# Patient Record
Sex: Male | Born: 1960 | Race: Black or African American | Hispanic: No | Marital: Married | Smoking: Current every day smoker
Health system: Southern US, Community
[De-identification: ages and names within clinical notes are randomized; demographics above are authoritative.]

## PROBLEM LIST (undated history)

## (undated) DIAGNOSIS — B20 Human immunodeficiency virus [HIV] disease: Secondary | ICD-10-CM

## (undated) DIAGNOSIS — Z21 Asymptomatic human immunodeficiency virus [HIV] infection status: Secondary | ICD-10-CM

## (undated) HISTORY — DX: Human immunodeficiency virus (HIV) disease: B20

## (undated) HISTORY — PX: OTHER SURGICAL HISTORY: SHX169

## (undated) HISTORY — PX: BACK SURGERY: SHX140

## (undated) HISTORY — DX: Asymptomatic human immunodeficiency virus (hiv) infection status: Z21

---

## 2016-02-16 ENCOUNTER — Ambulatory Visit: Payer: Self-pay

## 2016-02-17 ENCOUNTER — Encounter: Payer: Self-pay | Admitting: Internal Medicine

## 2016-02-26 ENCOUNTER — Ambulatory Visit: Payer: Self-pay | Admitting: Internal Medicine

## 2016-03-15 DIAGNOSIS — B20 Human immunodeficiency virus [HIV] disease: Secondary | ICD-10-CM | POA: Insufficient documentation

## 2016-03-16 ENCOUNTER — Ambulatory Visit (INDEPENDENT_AMBULATORY_CARE_PROVIDER_SITE_OTHER): Payer: Self-pay | Admitting: Internal Medicine

## 2016-03-16 ENCOUNTER — Encounter: Payer: Self-pay | Admitting: Internal Medicine

## 2016-03-16 ENCOUNTER — Ambulatory Visit: Payer: Self-pay | Admitting: *Deleted

## 2016-03-16 ENCOUNTER — Telehealth: Payer: Self-pay | Admitting: *Deleted

## 2016-03-16 DIAGNOSIS — I10 Essential (primary) hypertension: Secondary | ICD-10-CM

## 2016-03-16 DIAGNOSIS — Z8659 Personal history of other mental and behavioral disorders: Secondary | ICD-10-CM

## 2016-03-16 DIAGNOSIS — Z23 Encounter for immunization: Secondary | ICD-10-CM

## 2016-03-16 DIAGNOSIS — F411 Generalized anxiety disorder: Secondary | ICD-10-CM

## 2016-03-16 DIAGNOSIS — B20 Human immunodeficiency virus [HIV] disease: Secondary | ICD-10-CM

## 2016-03-16 DIAGNOSIS — F17210 Nicotine dependence, cigarettes, uncomplicated: Secondary | ICD-10-CM | POA: Insufficient documentation

## 2016-03-16 MED ORDER — HYDROCHLOROTHIAZIDE 25 MG PO TABS: 25.0000 mg | ORAL_TABLET | Freq: Every day | ORAL | 11 refills | Status: DC

## 2016-03-16 MED ORDER — ELVITEG-COBIC-EMTRICIT-TENOFAF 150-150-200-10 MG PO TABS: 1.0000 | ORAL_TABLET | Freq: Every day | ORAL | 11 refills | Status: DC

## 2016-03-16 MED ORDER — LISINOPRIL 5 MG PO TABS: 5.0000 mg | ORAL_TABLET | Freq: Every day | ORAL | 11 refills | Status: DC

## 2016-03-16 NOTE — Assessment & Plan Note (Signed)
His blood pressure is under reasonably good control with his current regimen.

## 2016-03-16 NOTE — Progress Notes (Signed)
Patient Active Problem List   Diagnosis Date Noted  . HIV disease (HCC) 03/15/2016    Priority: High  . Essential hypertension 03/16/2016  . Cigarette smoker 03/16/2016  . History of depression 03/16/2016    Patient's Medications  New Prescriptions   ELVITEGRAVIR-COBICISTAT-EMTRICITABINE-TENOFOVIR (GENVOYA) 150-150-200-10 MG TABS TABLET    Take 1 tablet by mouth daily with breakfast.  Previous Medications   HYDROCHLOROTHIAZIDE (HYDRODIURIL) 25 MG TABLET    Take 25 mg by mouth daily.   LISINOPRIL (PRINIVIL,ZESTRIL) 5 MG TABLET    Take 5 mg by mouth daily.  Modified Medications   No medications on file  Discontinued Medications   ABACAVIR-LAMIVUDINE (EPZICOM) 600-300 MG TABLET    Take 1 tablet by mouth daily.   DARUNAVIR (PREZISTA) 600 MG TABLET    Take 600 mg by mouth daily with breakfast.   RITONAVIR (NORVIR) 100 MG TABS TABLET    Take 100 mg by mouth daily with breakfast.    Subjective: Lucas Watts is in for his initial visit to establish ongoing care for his HIV infection. He is an exclusively gay male. In 1995 his sister noticed a new rash on his cheeks and suggested that he be tested for HIV infection. He tested positive and entered into care right away. He has been on a variety of antiretroviral regimens over the years and states that he has always had undetectable viral loads. He states that he has a very good circle of support. His husband tested positive about 2 years ago and is on therapy with a 1 pill a day regimen. Lucas Watts states that he is 100% adherent with his medications. He states that he is very positive and wants to live life to the fullest. He has never been hospitalized. He has never had surgery and has no known allergies. He states that he smokes about 3 cigarettes daily. He does not feel like he wants to try to quit currently. [place] to his husband's father who has some health problems. Lucas Watts is currently  looking for work.  Review of Systems: Review of Systems  Constitutional: Negative for chills, diaphoresis, fever, malaise/fatigue and weight loss.  HENT: Negative for sore throat.   Respiratory: Negative for cough, sputum production and shortness of breath.   Cardiovascular: Negative for chest pain.  Gastrointestinal: Negative for abdominal pain, diarrhea, nausea and vomiting.  Genitourinary: Negative for dysuria.  Musculoskeletal: Negative for joint pain.  Skin: Negative for rash.  Neurological: Negative for dizziness and headaches.  Psychiatric/Behavioral: Negative for depression and substance abuse. The patient is not nervous/anxious.     No past medical history on file.  Social History  Substance Use Topics  . Smoking status: Current Every Day Smoker    Packs/day: 0.30    Years: 43.00    Types: Cigarettes  . Smokeless tobacco: Never Used     Comment: never was a big smoker  . Alcohol use 12.6 oz/week    21 Cans of beer per week    No family history on file.  No Known Allergies  Objective:  Vitals:   03/16/16 0902  BP: 111/84  Pulse: (!) 58  Temp: 98.3 F (36.8 C)  TempSrc: Oral  Weight: 164 lb 8 oz (74.6 kg)  Height: 5\' 8"  (1.727 m)   Body mass index is 25.01 kg/m.  Physical Exam  Constitutional: He is oriented to person, place, and time.  He is talkative  and in good spirits.  HENT:  Mouth/Throat: No oropharyngeal exudate.  He has a partial upper plate.  Eyes: Conjunctivae are normal.  Cardiovascular: Normal rate and regular rhythm.   No murmur heard. Pulmonary/Chest: Effort normal and breath sounds normal. He has no wheezes. He has no rales.  Abdominal: Soft. He exhibits no mass. There is no tenderness.  Musculoskeletal: Normal range of motion.  Neurological: He is alert and oriented to person, place, and time.  Skin: No rash noted.  Psychiatric: Mood and affect normal.    Lab Results No results found for: WBC, HGB, HCT, MCV, PLT No results  found for: CREATININE, BUN, NA, K, CL, CO2 No results found for: ALT, AST, GGT, ALKPHOS, BILITOT  No results found for: CHOL, HDL, LDLCALC, LDLDIRECT, TRIG, CHOLHDL No results found for: [id number], HIV1RNAVL, CD4TABS   Problem List Items Addressed This Visit      High   HIV disease (HCC)    Records from his previous clinic indicate that his infection has been under excellent control with an undetectable viral load less than 30 and recent CD4 count of 610. There is no indication that he has ever developed resistance to any of his regimens. He recalls talking to his provider recently about possibly simplifying his regimen to one pill daily. He would like to try that. He currently takes his medication each morning after breakfast. I will simplify his regimen to Genvoya. He will follow-up after blood work in 6 weeks.      Relevant Medications   elvitegravir-cobicistat-emtricitabine-tenofovir (GENVOYA) 150-150-200-10 MG TABS tablet   Other Relevant Orders   T-helper cell (CD4)- ([facility] clinic only)   HIV 1 RNA quant-no reflex-bld   CBC   Comprehensive metabolic panel   Lipid panel   RPR     Unprioritized   Cigarette smoker    I talked to him today about the importance of cigarette cessation. He is only smoking about the 3 cigarettes daily. He does not feel he is currently prepared to quit.      Essential hypertension    His blood pressure is under reasonably good control with his current regimen.      Relevant Medications   lisinopril (PRINIVIL,ZESTRIL) 5 MG tablet   hydrochlorothiazide (HYDRODIURIL) 25 MG tablet   History of depression    Other Visit Diagnoses    Encounter for immunization       Relevant Orders   Flu Vaccine QUAD 36+ mos IM (Completed)        Cliffton Asters, MD [facility] [phone] pager   [phone] cell 03/16/2016, 12:17 PM

## 2016-03-16 NOTE — Assessment & Plan Note (Signed)
Records from his previous clinic indicate that his infection has been under excellent control with an undetectable viral load less than 30 and recent CD4 count of 610. There is no indication that he has ever developed resistance to any of his regimens. He recalls talking to his provider recently about possibly simplifying his regimen to one pill daily. He would like to try that. He currently takes his medication each morning after breakfast. I will simplify his regimen to Genvoya. He will follow-up after blood work in 6 weeks.

## 2016-03-16 NOTE — BH Specialist Note (Signed)
Counselor met with Lucas Watts today for a warm hand off.  Patient was oriented times four with good affect and dress. Patient was alert and talkative.  Patient shared that he and his husband had just moved to [facility] from [place] to help take care of his father in law who is not in good health.  Counselor provided support and encouragement accordingly. Counselor explained how the counseling services worked and provided patient a card with contact information on it.  Patient said that right now he was busy getting settled and would make an appointment if and when he had a need.    Rolena Infante, MA, LPC [facility]

## 2016-03-16 NOTE — Addendum Note (Signed)
Addended by: Cliffton AstersAMPBELL, Adore Kithcart on: 03/16/2016 12:24 PM   Modules accepted: Orders

## 2016-03-16 NOTE — Telephone Encounter (Signed)
I sent in refills of his lisinopril and hydrochlorothiazide.

## 2016-03-16 NOTE — Assessment & Plan Note (Signed)
I talked to him today about the importance of cigarette cessation. He is only smoking about the 3 cigarettes daily. He does not feel he is currently prepared to quit.

## 2016-03-16 NOTE — Telephone Encounter (Signed)
Patient called and advised he was seen today by Dr Orvan Falconerampbell and his B20 medication was sent to the pharmacy but his blood pressure medication was not and he wants to know if the doctor will fill that as well. Advised the patient will send the doctor a note and see if so I will call him back as soon as possible.

## 2016-03-25 ENCOUNTER — Encounter: Payer: Self-pay | Admitting: *Deleted

## 2016-05-25 ENCOUNTER — Other Ambulatory Visit: Payer: Self-pay

## 2016-06-08 ENCOUNTER — Ambulatory Visit (INDEPENDENT_AMBULATORY_CARE_PROVIDER_SITE_OTHER): Payer: Self-pay | Admitting: Internal Medicine

## 2016-06-08 ENCOUNTER — Encounter: Payer: Self-pay | Admitting: Internal Medicine

## 2016-06-08 DIAGNOSIS — F17210 Nicotine dependence, cigarettes, uncomplicated: Secondary | ICD-10-CM

## 2016-06-08 DIAGNOSIS — Z79899 Other long term (current) drug therapy: Secondary | ICD-10-CM

## 2016-06-08 DIAGNOSIS — I10 Essential (primary) hypertension: Secondary | ICD-10-CM

## 2016-06-08 DIAGNOSIS — Z8659 Personal history of other mental and behavioral disorders: Secondary | ICD-10-CM

## 2016-06-08 DIAGNOSIS — B20 Human immunodeficiency virus [HIV] disease: Secondary | ICD-10-CM

## 2016-06-08 DIAGNOSIS — Z113 Encounter for screening for infections with a predominantly sexual mode of transmission: Secondary | ICD-10-CM

## 2016-06-08 LAB — COMPREHENSIVE METABOLIC PANEL
ALBUMIN: 4.2 g/dL (ref 3.6–5.1)
ALK PHOS: 41 U/L (ref 40–115)
ALT: 16 U/L (ref 9–46)
AST: 24 U/L (ref 10–35)
BILIRUBIN TOTAL: 0.6 mg/dL (ref 0.2–1.2)
BUN: 15 mg/dL (ref 7–25)
CALCIUM: 9.2 mg/dL (ref 8.6–10.3)
CO2: 25 mmol/L (ref 20–31)
Chloride: 102 mmol/L (ref 98–110)
Creat: 1.39 mg/dL — ABNORMAL HIGH (ref 0.70–1.33)
Glucose, Bld: 94 mg/dL (ref 65–99)
Potassium: 3.8 mmol/L (ref 3.5–5.3)
Sodium: 136 mmol/L (ref 135–146)
TOTAL PROTEIN: 7.3 g/dL (ref 6.1–8.1)

## 2016-06-08 LAB — CBC
HEMATOCRIT: 41.8 % (ref 38.5–50.0)
HEMOGLOBIN: 14.1 g/dL (ref 13.2–17.1)
MCH: 36.2 pg — ABNORMAL HIGH (ref 27.0–33.0)
MCHC: 33.7 g/dL (ref 32.0–36.0)
MCV: 107.2 fL — ABNORMAL HIGH (ref 80.0–100.0)
MPV: 9 fL (ref 7.5–12.5)
Platelets: 236 10*3/uL (ref 140–400)
RBC: 3.9 MIL/uL — ABNORMAL LOW (ref 4.20–5.80)
RDW: 13.2 % (ref 11.0–15.0)
WBC: 4.6 10*3/uL (ref 3.8–10.8)

## 2016-06-08 LAB — LIPID PANEL
Cholesterol: 225 mg/dL — ABNORMAL HIGH (ref <200)
HDL: 64 mg/dL (ref >40)
LDL CALC: 102 mg/dL — AB (ref <100)
TRIGLYCERIDES: 297 mg/dL — AB (ref <150)
Total CHOL/HDL Ratio: 3.5 Ratio (ref <5.0)
VLDL: 59 mg/dL — ABNORMAL HIGH (ref <30)

## 2016-06-08 NOTE — Progress Notes (Signed)
Patient Active Problem List   Diagnosis Date Noted  . HIV disease (HCC) 03/15/2016    Priority: High  . Essential hypertension 03/16/2016  . Cigarette smoker 03/16/2016  . History of depression 03/16/2016    Patient's Medications  New Prescriptions   No medications on file  Previous Medications   ELVITEGRAVIR-COBICISTAT-EMTRICITABINE-TENOFOVIR (GENVOYA) 150-150-200-10 MG TABS TABLET    Take 1 tablet by mouth daily with breakfast.   HYDROCHLOROTHIAZIDE (HYDRODIURIL) 25 MG TABLET    Take 1 tablet (25 mg total) by mouth daily.   LISINOPRIL (PRINIVIL,ZESTRIL) 5 MG TABLET    Take 1 tablet (5 mg total) by mouth daily.  Modified Medications   No medications on file  Discontinued Medications   No medications on file    Subjective: Lucas Watts Is in for his routine HIV follow-up visit. At his last visit [facility]. He takes it each morning at 10 AM with food. He has not missed any doses and is tolerating it well. He continues to take his hydrochlorothiazide and lisinopril. He is still looking for work. He is down to 2 cigarettes daily. He says that he is wanting to quit by his birthday in October. He has cut down on his alcohol and he has not used any illicit drugs.   Review of Systems: Review of Systems  Constitutional: Negative for chills, diaphoresis, fever, malaise/fatigue and weight loss.  HENT: Negative for sore throat.   Respiratory: Negative for cough, sputum production and shortness of breath.   Cardiovascular: Negative for chest pain.  Gastrointestinal: Negative for abdominal pain, diarrhea, heartburn, nausea and vomiting.  Genitourinary: Negative for dysuria and frequency.  Musculoskeletal: Negative for joint pain and myalgias.  Skin: Negative for rash.  Neurological: Negative for dizziness and headaches.  Psychiatric/Behavioral: Negative for depression and substance abuse. The patient is not nervous/anxious.     No past medical history on  file.  Social History  Substance Use Topics  . Smoking status: Current Every Day Smoker    Packs/day: 0.30    Years: 43.00    Types: Cigarettes  . Smokeless tobacco: Never Used     Comment: never was a big smoker  . Alcohol use 12.6 oz/week    21 Cans of beer per week    No family history on file.  No Known Allergies  Objective:  There were no vitals filed for this visit. There is no height or weight on file to calculate BMI.  Physical Exam  Constitutional: He is oriented to person, place, and time.  HENT:  Mouth/Throat: No oropharyngeal exudate.  Eyes: Conjunctivae are normal.  Cardiovascular: Normal rate and regular rhythm.   No murmur heard. Pulmonary/Chest: Effort normal and breath sounds normal.  Abdominal: Soft. He exhibits no mass. There is no tenderness.  Musculoskeletal: Normal range of motion.  Neurological: He is alert and oriented to person, place, and time.  Skin: No rash noted.  Psychiatric: Mood and affect normal.    Lab Results No results found for: WBC, HGB, HCT, MCV, PLT No results found for: CREATININE, BUN, NA, K, CL, CO2 No results found for: ALT, AST, GGT, ALKPHOS, BILITOT  No results found for: CHOL, HDL, LDLCALC, LDLDIRECT, TRIG, CHOLHDL No results found for: [id number], HIV1RNAVL, CD4TABS   Problem List Items Addressed This Visit      High   HIV disease (HCC)    He is off to a good start with his new one pill regimen. His  adherence is excellent. I will check a full set of blood work today and see him back in 3 months.      Relevant Orders   T-helper cell (CD4)- ([facility] clinic only)   HIV 1 RNA quant-no reflex-bld   CBC   Comprehensive metabolic panel   RPR   Lipid panel     Unprioritized   Cigarette smoker    I talked him again about a cigarette cessation plan. I strongly suggested that he move up his quit date and let everyone around him know of his intention to quit and asked for their support.      Essential hypertension     His blood pressure was slightly above goal at 133/95.      History of depression    His depression is in remission.           Lucas Asters, MD [facility] for Infectious Disease [facility] [phone] pager   [phone] cell 06/08/2016, 8:48 AM

## 2016-06-08 NOTE — Assessment & Plan Note (Signed)
His blood pressure was slightly above goal at 133/95.

## 2016-06-08 NOTE — Assessment & Plan Note (Signed)
His depression is in remission. 

## 2016-06-08 NOTE — Assessment & Plan Note (Signed)
I talked him again about a cigarette cessation plan. I strongly suggested that he move up his quit date and let everyone around him know of his intention to quit and asked for their support.

## 2016-06-08 NOTE — Assessment & Plan Note (Signed)
He is off to a good start with his new one pill regimen. His adherence is excellent. I will check a full set of blood work today and see him back in 3 months.

## 2016-06-09 LAB — RPR

## 2016-06-09 LAB — T-HELPER CELL (CD4) - (RCID CLINIC ONLY)
CD4 % Helper T Cell: 26 % — ABNORMAL LOW (ref 33–55)
CD4 T CELL ABS: 560 /uL (ref 400–2700)

## 2016-06-11 LAB — HIV-1 RNA QUANT-NO REFLEX-BLD
HIV 1 RNA QUANT: NOT DETECTED {copies}/mL (ref <20)
HIV-1 RNA Quant, Log: <1.3 Log copies/mL (ref <1.30)

## 2016-09-06 DIAGNOSIS — N289 Disorder of kidney and ureter, unspecified: Secondary | ICD-10-CM | POA: Insufficient documentation

## 2016-09-06 DIAGNOSIS — E785 Hyperlipidemia, unspecified: Secondary | ICD-10-CM | POA: Insufficient documentation

## 2016-09-07 ENCOUNTER — Ambulatory Visit: Payer: Self-pay | Admitting: Internal Medicine

## 2017-03-09 ENCOUNTER — Ambulatory Visit (INDEPENDENT_AMBULATORY_CARE_PROVIDER_SITE_OTHER): Payer: Self-pay

## 2017-03-09 DIAGNOSIS — Z23 Encounter for immunization: Secondary | ICD-10-CM

## 2017-07-19 ENCOUNTER — Encounter: Payer: Self-pay | Admitting: Internal Medicine

## 2017-07-26 ENCOUNTER — Ambulatory Visit (INDEPENDENT_AMBULATORY_CARE_PROVIDER_SITE_OTHER): Payer: Self-pay | Admitting: Internal Medicine

## 2017-07-26 ENCOUNTER — Encounter: Payer: Self-pay | Admitting: Internal Medicine

## 2017-07-26 DIAGNOSIS — B20 Human immunodeficiency virus [HIV] disease: Secondary | ICD-10-CM

## 2017-07-26 DIAGNOSIS — I10 Essential (primary) hypertension: Secondary | ICD-10-CM

## 2017-07-26 DIAGNOSIS — F17210 Nicotine dependence, cigarettes, uncomplicated: Secondary | ICD-10-CM

## 2017-07-26 NOTE — Assessment & Plan Note (Signed)
I encouraged him to consider the benefits of cigarette cessation.

## 2017-07-26 NOTE — Assessment & Plan Note (Signed)
His blood pressure is at goal but will need to be monitored closely as he does have some renal insufficiency.

## 2017-07-26 NOTE — Assessment & Plan Note (Signed)
His adherence has been perfect and as a result his infection has been under excellent, long-term control.  He will continue Genvoya and get repeat lab work today.  He will follow-up after lab work in 1 year.

## 2017-07-26 NOTE — Progress Notes (Signed)
Patient Active Problem List   Diagnosis Date Noted  . HIV disease (HCC) 03/15/2016    Priority: High  . Dyslipidemia 09/06/2016  . Renal insufficiency 09/06/2016  . Essential hypertension 03/16/2016  . Cigarette smoker 03/16/2016  . History of depression 03/16/2016    Patient's Medications  New Prescriptions   No medications on file  Previous Medications   ELVITEGRAVIR-COBICISTAT-EMTRICITABINE-TENOFOVIR (GENVOYA) 150-150-200-10 MG TABS TABLET    Take 1 tablet by mouth daily with breakfast.   HYDROCHLOROTHIAZIDE (HYDRODIURIL) 25 MG TABLET    Take 1 tablet (25 mg total) by mouth daily.   LISINOPRIL (PRINIVIL,ZESTRIL) 5 MG TABLET    Take 1 tablet (5 mg total) by mouth daily.  Modified Medications   No medications on file  Discontinued Medications   No medications on file    Subjective: Bryse for his first visit in 1 year.  He [place] Is[place] occasions after his last visit here in January of last year.  His viral load remained undetectable as of last April.  He denies having any trouble obtaining, taking or tolerating his Genvoya.  He takes it each morning with breakfast.  He denies missing any doses.  He also continues to take his hydrochlorothiazide and lisinopril.  He is not on any other medications.  He continues to smoke cigarettes and says that he is in "no hurry" to quit.  Review of Systems: Review of Systems  Constitutional: Negative for chills, diaphoresis, fever, malaise/fatigue and weight loss.  HENT: Negative for sore throat.   Respiratory: Negative for cough, sputum production and shortness of breath.   Cardiovascular: Negative for chest pain.  Gastrointestinal: Negative for abdominal pain, diarrhea, heartburn, nausea and vomiting.  Genitourinary: Negative for dysuria and frequency.  Musculoskeletal: Negative for joint pain and myalgias.  Skin: Negative for rash.  Neurological: Negative for dizziness and headaches.    Psychiatric/Behavioral: Negative for depression and substance abuse. The patient is not nervous/anxious.     No past medical history on file.  Social History   Tobacco Use  . Smoking status: Current Every Day Smoker    Packs/day: 0.30    Years: 43.00    Pack years: 12.90    Types: Cigarettes  . Smokeless tobacco: Never Used  . Tobacco comment: never was a big smoker  Substance Use Topics  . Alcohol use: Yes    Alcohol/week: 12.6 oz    Types: 21 Cans of beer per week  . Drug use: No    No family history on file.  No Known Allergies  Health Maintenance  Topic Date Due  . Hepatitis C Screening  28-Nov-1960  . TETANUS/TDAP  02/25/1981  . COLONOSCOPY  02/26/2012  . INFLUENZA VACCINE  Completed  . HIV Screening  Completed    Objective:  Vitals:   07/26/17 0836  BP: 136/86  Pulse: 70  Temp: 97.9 F (36.6 C)  TempSrc: Oral  Weight: 174 lb (78.9 kg)  Height: 5\' 8"  (1.727 m)   Body mass index is 26.46 kg/m.  Physical Exam  Constitutional: He is oriented to person, place, and time.  HENT:  Mouth/Throat: No oropharyngeal exudate.  Eyes: Conjunctivae are normal.  Cardiovascular: Normal rate and regular rhythm.  No murmur heard. Pulmonary/Chest: Effort normal and breath sounds normal. He has no wheezes. He has no rales.  Abdominal: Soft. He exhibits no mass. There is no tenderness.  Musculoskeletal: Normal range of motion.  Neurological: He  is alert and oriented to person, place, and time.  Skin: No rash noted.  Psychiatric: Mood and affect normal.    Lab Results Lab Results  Component Value Date   WBC 4.6 06/08/2016   HGB 14.1 06/08/2016   HCT 41.8 06/08/2016   MCV 107.2 (H) 06/08/2016   PLT 236 06/08/2016    Lab Results  Component Value Date   CREATININE 1.39 (H) 06/08/2016   BUN 15 06/08/2016   NA 136 06/08/2016   K 3.8 06/08/2016   CL 102 06/08/2016   CO2 25 06/08/2016    Lab Results  Component Value Date   ALT 16 06/08/2016   AST 24  06/08/2016   ALKPHOS 41 06/08/2016   BILITOT 0.6 06/08/2016    Lab Results  Component Value Date   CHOL 225 (H) 06/08/2016   HDL 64 06/08/2016   LDLCALC 102 (H) 06/08/2016   TRIG 297 (H) 06/08/2016   CHOLHDL 3.5 06/08/2016   Lab Results  Component Value Date   LABRPR NON REAC 06/08/2016   HIV 1 RNA Quant (copies/mL)  Date Value  06/08/2016 <20 NOT DETECTED   CD4 T Cell Abs (/uL)  Date Value  06/08/2016 560     Problem List Items Addressed This Visit      High   HIV disease (HCC)    His adherence has been perfect and as a result his infection has been under excellent, long-term control.  He will continue Genvoya and get repeat lab work today.  He will follow-up after lab work in 1 year.      Relevant Orders   T-helper cell (CD4)- ([facility] clinic only)   HIV 1 RNA quant-no reflex-bld   CBC   Comprehensive metabolic panel   RPR   Lipid panel   CBC   T-helper cell (CD4)- ([facility] clinic only)   Comprehensive metabolic panel   Lipid panel   RPR   HIV 1 RNA quant-no reflex-bld     Unprioritized   Cigarette smoker    I encouraged him to consider the benefits of cigarette cessation.      Essential hypertension    His blood pressure is at goal but will need to be monitored closely as he does have some renal insufficiency.           Cliffton Asters, MD [facility] for Infectious Disease [facility] [phone] pager   [phone] cell 07/26/2017, 8:57 AM

## 2017-07-27 LAB — COMPREHENSIVE METABOLIC PANEL
AG Ratio: 1.4 (calc) (ref 1.0–2.5)
ALKALINE PHOSPHATASE (APISO): 44 U/L (ref 40–115)
ALT: 17 U/L (ref 9–46)
AST: 31 U/L (ref 10–35)
Albumin: 4.3 g/dL (ref 3.6–5.1)
BUN / CREAT RATIO: 10 (calc) (ref 6–22)
BUN: 15 mg/dL (ref 7–25)
CO2: 24 mmol/L (ref 20–32)
CREATININE: 1.5 mg/dL — AB (ref 0.70–1.33)
Calcium: 9.4 mg/dL (ref 8.6–10.3)
Chloride: 101 mmol/L (ref 98–110)
GLUCOSE: 96 mg/dL (ref 65–99)
Globulin: 3.1 g/dL (calc) (ref 1.9–3.7)
Potassium: 3.7 mmol/L (ref 3.5–5.3)
Sodium: 135 mmol/L (ref 135–146)
Total Bilirubin: 0.6 mg/dL (ref 0.2–1.2)
Total Protein: 7.4 g/dL (ref 6.1–8.1)

## 2017-07-27 LAB — LIPID PANEL
CHOL/HDL RATIO: 3.6 (calc) (ref <5.0)
CHOLESTEROL: 236 mg/dL — AB (ref <200)
HDL: 65 mg/dL (ref >40)
LDL Cholesterol (Calc): 117 mg/dL (calc) — ABNORMAL HIGH
NON-HDL CHOLESTEROL (CALC): 171 mg/dL — AB (ref <130)
Triglycerides: 378 mg/dL — ABNORMAL HIGH (ref <150)

## 2017-07-27 LAB — T-HELPER CELL (CD4) - (RCID CLINIC ONLY)
CD4 T CELL ABS: 670 /uL (ref 400–2700)
CD4 T CELL HELPER: 32 % — AB (ref 33–55)

## 2017-07-27 LAB — CBC
HCT: 39.4 % (ref 38.5–50.0)
Hemoglobin: 13.9 g/dL (ref 13.2–17.1)
MCH: 36.5 pg — AB (ref 27.0–33.0)
MCHC: 35.3 g/dL (ref 32.0–36.0)
MCV: 103.4 fL — ABNORMAL HIGH (ref 80.0–100.0)
MPV: 8.7 fL (ref 7.5–12.5)
PLATELETS: 262 10*3/uL (ref 140–400)
RBC: 3.81 10*6/uL — AB (ref 4.20–5.80)
RDW: 12.2 % (ref 11.0–15.0)
WBC: 4.5 10*3/uL (ref 3.8–10.8)

## 2017-07-27 LAB — RPR: RPR: NONREACTIVE

## 2017-07-28 LAB — HIV-1 RNA QUANT-NO REFLEX-BLD
HIV 1 RNA Quant: <20 copies/mL
HIV-1 RNA QUANT, LOG: NOT DETECTED {Log_copies}/mL

## 2017-08-17 ENCOUNTER — Other Ambulatory Visit: Payer: Self-pay | Admitting: *Deleted

## 2017-08-17 DIAGNOSIS — I10 Essential (primary) hypertension: Secondary | ICD-10-CM

## 2017-08-17 DIAGNOSIS — B20 Human immunodeficiency virus [HIV] disease: Secondary | ICD-10-CM

## 2017-08-17 MED ORDER — HYDROCHLOROTHIAZIDE 25 MG PO TABS: 5.0000 mg | ORAL_TABLET | Freq: Every day | ORAL | 5 refills | Status: DC

## 2017-08-17 MED ORDER — ELVITEG-COBIC-EMTRICIT-TENOFAF 150-150-200-10 MG PO TABS: 1.0000 | ORAL_TABLET | Freq: Every day | ORAL | 5 refills | Status: DC

## 2017-08-17 MED ORDER — LISINOPRIL 5 MG PO TABS: 5.0000 mg | ORAL_TABLET | Freq: Every day | ORAL | 5 refills | Status: DC

## 2017-08-17 NOTE — Progress Notes (Signed)
Patient calling for medication refills, states [facility] never received permission from his doctor.  RN noted that [facility] has not requested the medications from Dr Orvan Falconerampbell, but perhaps from Dr Sallyanne KusterSellers. RN refilled medications per last office note. Andree CossHowell, Derotha Fishbaugh M, RN

## 2018-01-04 ENCOUNTER — Other Ambulatory Visit: Payer: Self-pay | Admitting: Internal Medicine

## 2018-01-04 DIAGNOSIS — I10 Essential (primary) hypertension: Secondary | ICD-10-CM

## 2018-01-04 DIAGNOSIS — B20 Human immunodeficiency virus [HIV] disease: Secondary | ICD-10-CM

## 2018-02-24 ENCOUNTER — Encounter: Payer: Self-pay | Admitting: Internal Medicine

## 2018-03-02 ENCOUNTER — Ambulatory Visit (INDEPENDENT_AMBULATORY_CARE_PROVIDER_SITE_OTHER): Payer: Self-pay

## 2018-03-02 DIAGNOSIS — Z23 Encounter for immunization: Secondary | ICD-10-CM

## 2018-05-09 ENCOUNTER — Other Ambulatory Visit: Payer: Self-pay | Admitting: Internal Medicine

## 2018-05-09 DIAGNOSIS — I10 Essential (primary) hypertension: Secondary | ICD-10-CM

## 2018-06-29 ENCOUNTER — Other Ambulatory Visit: Payer: Self-pay | Admitting: Internal Medicine

## 2018-06-29 DIAGNOSIS — B20 Human immunodeficiency virus [HIV] disease: Secondary | ICD-10-CM

## 2018-06-29 DIAGNOSIS — I10 Essential (primary) hypertension: Secondary | ICD-10-CM

## 2018-07-03 ENCOUNTER — Other Ambulatory Visit: Payer: Self-pay

## 2018-07-03 DIAGNOSIS — B20 Human immunodeficiency virus [HIV] disease: Secondary | ICD-10-CM

## 2018-07-04 LAB — T-HELPER CELL (CD4) - (RCID CLINIC ONLY)
CD4 T CELL HELPER: 28 % — AB (ref 33–55)
CD4 T Cell Abs: 550 /uL (ref 400–2700)

## 2018-07-05 LAB — CBC
HCT: 39.3 % (ref 38.5–50.0)
Hemoglobin: 13.7 g/dL (ref 13.2–17.1)
MCH: 36.8 pg — ABNORMAL HIGH (ref 27.0–33.0)
MCHC: 34.9 g/dL (ref 32.0–36.0)
MCV: 105.6 fL — ABNORMAL HIGH (ref 80.0–100.0)
MPV: 8.9 fL (ref 7.5–12.5)
Platelets: 238 10*3/uL (ref 140–400)
RBC: 3.72 10*6/uL — AB (ref 4.20–5.80)
RDW: 12 % (ref 11.0–15.0)
WBC: 4 10*3/uL (ref 3.8–10.8)

## 2018-07-05 LAB — COMPREHENSIVE METABOLIC PANEL
AG Ratio: 1.2 (calc) (ref 1.0–2.5)
ALBUMIN MSPROF: 3.9 g/dL (ref 3.6–5.1)
ALKALINE PHOSPHATASE (APISO): 36 U/L (ref 35–144)
ALT: 15 U/L (ref 9–46)
AST: 23 U/L (ref 10–35)
BUN: 16 mg/dL (ref 7–25)
CO2: 26 mmol/L (ref 20–32)
Calcium: 9.1 mg/dL (ref 8.6–10.3)
Chloride: 105 mmol/L (ref 98–110)
Creat: 1.27 mg/dL (ref 0.70–1.33)
Globulin: 3.2 g/dL (calc) (ref 1.9–3.7)
Glucose, Bld: 90 mg/dL (ref 65–99)
Potassium: 4.3 mmol/L (ref 3.5–5.3)
Sodium: 139 mmol/L (ref 135–146)
Total Bilirubin: 0.5 mg/dL (ref 0.2–1.2)
Total Protein: 7.1 g/dL (ref 6.1–8.1)

## 2018-07-05 LAB — LIPID PANEL
Cholesterol: 220 mg/dL — ABNORMAL HIGH (ref <200)
HDL: 73 mg/dL (ref >=40)
LDL Cholesterol (Calc): 118 mg/dL (calc) — ABNORMAL HIGH
Non-HDL Cholesterol (Calc): 147 mg/dL (calc) — ABNORMAL HIGH (ref <130)
Total CHOL/HDL Ratio: 3 (calc) (ref <5.0)
Triglycerides: 176 mg/dL — ABNORMAL HIGH (ref <150)

## 2018-07-05 LAB — HIV-1 RNA QUANT-NO REFLEX-BLD
HIV 1 RNA QUANT: NOT DETECTED {copies}/mL
HIV-1 RNA Quant, Log: <1.3 Log copies/mL

## 2018-07-05 LAB — RPR: RPR Ser Ql: NONREACTIVE

## 2018-07-13 ENCOUNTER — Other Ambulatory Visit: Payer: Self-pay

## 2018-07-21 ENCOUNTER — Encounter: Payer: Self-pay | Admitting: Internal Medicine

## 2018-07-21 ENCOUNTER — Ambulatory Visit: Payer: Self-pay

## 2018-07-27 ENCOUNTER — Encounter: Payer: Self-pay | Admitting: Internal Medicine

## 2018-07-27 ENCOUNTER — Other Ambulatory Visit: Payer: Self-pay | Admitting: Infectious Diseases

## 2018-07-27 DIAGNOSIS — B20 Human immunodeficiency virus [HIV] disease: Secondary | ICD-10-CM

## 2018-08-22 ENCOUNTER — Other Ambulatory Visit: Payer: Self-pay | Admitting: Infectious Diseases

## 2018-08-22 DIAGNOSIS — B20 Human immunodeficiency virus [HIV] disease: Secondary | ICD-10-CM

## 2018-08-24 ENCOUNTER — Encounter: Payer: Self-pay | Admitting: Internal Medicine

## 2018-08-24 ENCOUNTER — Other Ambulatory Visit: Payer: Self-pay

## 2018-08-24 ENCOUNTER — Ambulatory Visit (INDEPENDENT_AMBULATORY_CARE_PROVIDER_SITE_OTHER): Payer: Self-pay | Admitting: Internal Medicine

## 2018-08-24 DIAGNOSIS — B20 Human immunodeficiency virus [HIV] disease: Secondary | ICD-10-CM

## 2018-08-24 MED ORDER — ELVITEG-COBIC-EMTRICIT-TENOFAF 150-150-200-10 MG PO TABS: 1.0000 | ORAL_TABLET | Freq: Every day | ORAL | 11 refills | Status: DC

## 2018-08-24 NOTE — Progress Notes (Signed)
Virtual Visit via Telephone Note  I connected with Lucas Watts on 08/24/18 at  9:15 AM EDT by telephone and verified that I am speaking with the correct person using two identifiers.   I discussed the limitations, risks, security and privacy concerns of performing an evaluation and management service by telephone and the availability of in person appointments. I also discussed with the patient that there may be a patient responsible charge related to this service. The patient expressed understanding and agreed to proceed.   History of Present Illness: I spoke to the [facility] by phone this morning.  He ran out of refills of his Genvoya last week since it has been 1 year since his last visit.  His pharmacy did call us and he has not had to miss any doses.  He is tolerating it well.  He has not been started on any new medications in the past year.  He continues to smoke about a quarter of a pack of cigarettes daily.  He has no current plans or desire to quit.   Observations/Objective: HIV 1 RNA Quant (copies/mL)  Date Value  07/03/2018 <20 NOT DETECTED  07/26/2017 <20 NOT DETECTED  06/08/2016 <20 NOT DETECTED   CD4 T Cell Abs (/uL)  Date Value  07/03/2018 550  07/26/2017 670  06/08/2016 560    Assessment and Plan: His infection remains under excellent long-term control.  I have sent and more refills of his Genvoya.  He will follow-up here after lab work in 3 months.  I talked to him again about the importance of cigarette cessation.  Follow Up Instructions:    I discussed the assessment and treatment plan with the patient. The patient was provided an opportunity to ask questions and all were answered. The patient agreed with the plan and demonstrated an understanding of the instructions.   The patient was advised to call back or seek an in-person evaluation if the symptoms worsen or if the condition fails to improve as anticipated.  I provided 12 minutes of non-face-to-face  time during this encounter.   Cliffton Asters, MD

## 2018-09-18 ENCOUNTER — Other Ambulatory Visit: Payer: Self-pay | Admitting: Internal Medicine

## 2018-09-18 DIAGNOSIS — I10 Essential (primary) hypertension: Secondary | ICD-10-CM

## 2018-11-21 ENCOUNTER — Other Ambulatory Visit: Payer: Self-pay

## 2018-11-21 DIAGNOSIS — B20 Human immunodeficiency virus [HIV] disease: Secondary | ICD-10-CM

## 2018-11-22 LAB — T-HELPER CELL (CD4) - (RCID CLINIC ONLY)
CD4 % Helper T Cell: 39 % (ref 33–65)
CD4 T Cell Abs: 802 /uL (ref 400–1790)

## 2018-11-28 ENCOUNTER — Other Ambulatory Visit: Payer: Self-pay

## 2018-11-30 LAB — HIV-1 RNA QUANT-NO REFLEX-BLD
HIV 1 RNA Quant: <20 copies/mL
HIV-1 RNA Quant, Log: <1.3 Log copies/mL

## 2018-12-12 ENCOUNTER — Encounter: Payer: Self-pay | Admitting: Internal Medicine

## 2018-12-12 ENCOUNTER — Other Ambulatory Visit: Payer: Self-pay

## 2018-12-12 ENCOUNTER — Ambulatory Visit (INDEPENDENT_AMBULATORY_CARE_PROVIDER_SITE_OTHER): Payer: Self-pay | Admitting: Internal Medicine

## 2018-12-12 DIAGNOSIS — B20 Human immunodeficiency virus [HIV] disease: Secondary | ICD-10-CM

## 2018-12-12 DIAGNOSIS — N289 Disorder of kidney and ureter, unspecified: Secondary | ICD-10-CM

## 2018-12-12 DIAGNOSIS — F17210 Nicotine dependence, cigarettes, uncomplicated: Secondary | ICD-10-CM

## 2018-12-12 DIAGNOSIS — I10 Essential (primary) hypertension: Secondary | ICD-10-CM

## 2018-12-12 DIAGNOSIS — Z8659 Personal history of other mental and behavioral disorders: Secondary | ICD-10-CM

## 2018-12-12 MED ORDER — GENVOYA 150-150-200-10 MG PO TABS: 1.0000 | ORAL_TABLET | Freq: Every day | ORAL | 11 refills | Status: DC

## 2018-12-12 NOTE — Assessment & Plan Note (Signed)
His depression is in remission. 

## 2018-12-12 NOTE — Assessment & Plan Note (Signed)
I encouraged him to do the best to quit smoking.  He said that he would try.

## 2018-12-12 NOTE — Assessment & Plan Note (Signed)
His creatinine was back to normal in February.

## 2018-12-12 NOTE — Assessment & Plan Note (Signed)
He has excellent, long-term control of his infection.  He will continue Genvoya and follow-up after lab work in 1 year.

## 2018-12-12 NOTE — Progress Notes (Signed)
Patient Active Problem List   Diagnosis Date Noted  . HIV disease ([place]) 03/15/2016    Priority: High  . Dyslipidemia 09/06/2016  . Renal insufficiency 09/06/2016  . Essential hypertension 03/16/2016  . Cigarette smoker 03/16/2016  . History of depression 03/16/2016    Patient's Medications  New Prescriptions   No medications on file  Previous Medications   HYDROCHLOROTHIAZIDE (HYDRODIURIL) 25 MG TABLET    TAKE 1/2 TABLET BY MOUTH EVERY DAY   LISINOPRIL (PRINIVIL,ZESTRIL) 5 MG TABLET    TAKE 1 TABLET BY MOUTH EVERY DAY  Modified Medications   Modified Medication Previous Medication   ELVITEGRAVIR-COBICISTAT-EMTRICITABINE-TENOFOVIR (GENVOYA) 150-150-200-10 MG TABS TABLET elvitegravir-cobicistat-emtricitabine-tenofovir (GENVOYA) 150-150-200-10 MG TABS tablet      Take 1 tablet by mouth daily with breakfast.    Take 1 tablet by mouth daily with breakfast.  Discontinued Medications   No medications on file    Subjective: Lucas Watts is in for his routine HIV follow-up visit.  He has not had any problems obtaining, taking or tolerating his Genvoya and tells me that he never misses a single dose.  He continues to take his blood pressure medication.  He is on no new medications.  He continues to smoke 2 cigarettes daily and says that he has no current plans to quit.  He denies feeling depressed.  His male partner (husband) is also living with HIV.  He is treated here and his viral load is undetectable.  Review of Systems: Review of Systems  Constitutional: Negative for chills, diaphoresis, fever, malaise/fatigue and weight loss.  HENT: Negative for sore throat.   Respiratory: Negative for cough, sputum production and shortness of breath.   Cardiovascular: Negative for chest pain.  Gastrointestinal: Negative for abdominal pain, diarrhea, heartburn, nausea and vomiting.  Genitourinary: Negative for dysuria and frequency.  Musculoskeletal: Negative for joint pain and myalgias.   Skin: Negative for rash.  Neurological: Negative for dizziness and headaches.  Psychiatric/Behavioral: Negative for depression and substance abuse. The patient is not nervous/anxious.     No past medical history on file.  Social History   Tobacco Use  . Smoking status: Current Every Day Smoker    Packs/day: 0.30    Years: 43.00    Pack years: 12.90    Types: Cigarettes  . Smokeless tobacco: Never Used  . Tobacco comment: never was a big smoker  Substance Use Topics  . Alcohol use: Yes    Alcohol/week: 21.0 standard drinks    Types: 21 Cans of beer per week  . Drug use: No    No family history on file.  No Known Allergies  Health Maintenance  Topic Date Due  . Hepatitis C Screening  Feb 20, 1961  . COLONOSCOPY  02/26/2012  . INFLUENZA VACCINE  12/23/2018  . TETANUS/TDAP  12/25/2026  . HIV Screening  Completed    Objective:  Vitals:   12/12/18 1338  BP: 106/71  Pulse: 94  Temp: 98 F (36.7 C)  Weight: 170 lb (77.1 kg)  Height: 5\' 8"  (1.727 m)   Body mass index is 25.85 kg/m.  Physical Exam HENT:     Mouth/Throat:     Pharynx: No oropharyngeal exudate.  Eyes:     Conjunctiva/sclera: Conjunctivae normal.  Cardiovascular:     Rate and Rhythm: Normal rate and regular rhythm.     Heart sounds: No murmur.  Pulmonary:     Effort: Pulmonary effort is normal.     Breath sounds: Normal breath  sounds.  Abdominal:     Palpations: Abdomen is soft. There is no mass.     Tenderness: There is no abdominal tenderness.  Musculoskeletal: Normal range of motion.  Skin:    Findings: No rash.  Neurological:     Mental Status: He is alert and oriented to person, place, and time.  Psychiatric:        Mood and Affect: Mood normal.     Lab Results Lab Results  Component Value Date   WBC 4.0 07/03/2018   HGB 13.7 07/03/2018   HCT 39.3 07/03/2018   MCV 105.6 (H) 07/03/2018   PLT 238 07/03/2018    Lab Results  Component Value Date   CREATININE 1.27 07/03/2018    BUN 16 07/03/2018   NA 139 07/03/2018   K 4.3 07/03/2018   CL 105 07/03/2018   CO2 26 07/03/2018    Lab Results  Component Value Date   ALT 15 07/03/2018   AST 23 07/03/2018   ALKPHOS 41 06/08/2016   BILITOT 0.5 07/03/2018    Lab Results  Component Value Date   CHOL 220 (H) 07/03/2018   HDL 73 07/03/2018   LDLCALC 118 (H) 07/03/2018   TRIG 176 (H) 07/03/2018   CHOLHDL 3.0 07/03/2018   Lab Results  Component Value Date   LABRPR NON-REACTIVE 07/03/2018   HIV 1 RNA Quant (copies/mL)  Date Value  11/21/2018 <20 NOT DETECTED  07/03/2018 <20 NOT DETECTED  07/26/2017 <20 NOT DETECTED   CD4 T Cell Abs (/uL)  Date Value  11/21/2018 802  07/03/2018 550  07/26/2017 670     Problem List Items Addressed This Visit      High   HIV disease (HCC)    He has excellent, long-term control of his infection.  He will continue Genvoya and follow-up after lab work in 1 year.      Relevant Medications   elvitegravir-cobicistat-emtricitabine-tenofovir (GENVOYA) 150-150-200-10 MG TABS tablet   Other Relevant Orders   CBC   T-helper cell (CD4)- ([facility] clinic only)   Comprehensive metabolic panel   Lipid panel   RPR   HIV-1 RNA quant-no reflex-bld     Unprioritized   Renal insufficiency    His creatinine was back to normal in February.      History of depression    His depression is in remission.      Essential hypertension    His blood pressure is at goal.      Cigarette smoker    I encouraged him to do the best to quit smoking.  He said that he would try.           Cliffton AstersJohn Laquashia Mergenthaler, MD [facility] [phone] pager   [phone] cell 12/12/2018, 2:01 PM

## 2018-12-12 NOTE — Assessment & Plan Note (Signed)
His blood pressure is at goal. 

## 2018-12-26 ENCOUNTER — Other Ambulatory Visit: Payer: Self-pay | Admitting: Infectious Diseases

## 2018-12-26 DIAGNOSIS — I10 Essential (primary) hypertension: Secondary | ICD-10-CM

## 2019-01-02 ENCOUNTER — Other Ambulatory Visit: Payer: Self-pay

## 2019-01-02 ENCOUNTER — Telehealth: Payer: Self-pay

## 2019-01-02 ENCOUNTER — Ambulatory Visit: Payer: Self-pay

## 2019-01-02 MED ORDER — HYDROCHLOROTHIAZIDE 12.5 MG PO TABS: 12.5000 mg | ORAL_TABLET | Freq: Every day | ORAL | 5 refills | Status: DC

## 2019-01-02 NOTE — Telephone Encounter (Signed)
Patient her for financial renewal today and requested nurse visit for medication concern. Patient is receiving hydrochlorothiazide  25mg  take 1/2 tablet daily. Patient does not feel comfortable breaking the tablet in half. Received verbal order per Dr. Megan Salon to d/c current prescription and take hydrochlorothiazide 12.5mg  1 tablet by mouth daily. New rx sent electronically to [facility]. Patient made aware and was satisfied.  Eugenia Mcalpine, LPN

## 2019-01-03 ENCOUNTER — Encounter: Payer: Self-pay | Admitting: Internal Medicine

## 2019-06-06 ENCOUNTER — Other Ambulatory Visit: Payer: Self-pay | Admitting: Infectious Diseases

## 2019-06-06 DIAGNOSIS — I10 Essential (primary) hypertension: Secondary | ICD-10-CM

## 2019-07-09 ENCOUNTER — Other Ambulatory Visit: Payer: Self-pay

## 2019-07-09 MED ORDER — HYDROCHLOROTHIAZIDE 12.5 MG PO TABS: 12.5000 mg | ORAL_TABLET | Freq: Every day | ORAL | 4 refills | Status: DC

## 2019-07-17 ENCOUNTER — Other Ambulatory Visit: Payer: Self-pay

## 2019-07-17 ENCOUNTER — Ambulatory Visit: Payer: Self-pay

## 2019-07-18 ENCOUNTER — Encounter: Payer: Self-pay | Admitting: Internal Medicine

## 2019-07-31 ENCOUNTER — Other Ambulatory Visit: Payer: Self-pay | Admitting: Internal Medicine

## 2019-07-31 DIAGNOSIS — B20 Human immunodeficiency virus [HIV] disease: Secondary | ICD-10-CM

## 2019-08-23 ENCOUNTER — Ambulatory Visit: Payer: Self-pay | Attending: Internal Medicine

## 2019-08-23 DIAGNOSIS — Z23 Encounter for immunization: Secondary | ICD-10-CM

## 2019-08-23 NOTE — Progress Notes (Signed)
   Covid-19 Vaccination Clinic  Name:  Lucas Watts    MRN: [id number] DOB: 1960-10-23  08/23/2019  Lucas Watts was observed post Covid-19 immunization for 15 minutes without incident. He was provided with Vaccine Information Sheet and instruction to access the V-Safe system.   Lucas Watts was instructed to call 911 with any severe reactions post vaccine: Marland Kitchen Difficulty breathing  . Swelling of face and throat  . A fast heartbeat  . A bad rash all over body  . Dizziness and weakness   Immunizations Administered    Name Date Dose VIS Date Route   Pfizer COVID-19 Vaccine 08/23/2019  3:12 PM 0.3 mL 05/04/2019 Intramuscular   Manufacturer: [employer], [employer]   Lot: [id number]   NDC: [address]-4    pt reported no post vaccine symptoms

## 2019-09-26 ENCOUNTER — Ambulatory Visit: Payer: Self-pay | Attending: Internal Medicine

## 2019-09-26 DIAGNOSIS — Z23 Encounter for immunization: Secondary | ICD-10-CM

## 2019-09-26 NOTE — Progress Notes (Signed)
   Covid-19 Vaccination Clinic  Name:  Lucas Watts    MRN: [id number] DOB: Aug 06, 1960  09/26/2019  [name] was observed post Covid-19 immunization for 15 minutes without incident. He was provided with Vaccine Information Sheet and instruction to access the V-Safe system.   [name] was instructed to call 911 with any severe reactions post vaccine: Marland Kitchen Difficulty breathing  . Swelling of face and throat  . A fast heartbeat  . A bad rash all over body  . Dizziness and weakness   Immunizations Administered    Name Date Dose VIS Date Route   Pfizer COVID-19 Vaccine 09/26/2019  2:32 PM 0.3 mL 07/18/2018 Intramuscular   Manufacturer: [employer], [employer]   Lot: [id number]   NDC: [address]-8

## 2019-11-08 ENCOUNTER — Other Ambulatory Visit: Payer: Self-pay

## 2019-11-08 ENCOUNTER — Other Ambulatory Visit: Payer: Self-pay | Admitting: Infectious Diseases

## 2019-11-08 ENCOUNTER — Other Ambulatory Visit: Payer: Self-pay | Admitting: Internal Medicine

## 2019-11-08 DIAGNOSIS — I10 Essential (primary) hypertension: Secondary | ICD-10-CM

## 2019-11-28 ENCOUNTER — Other Ambulatory Visit: Payer: Self-pay

## 2019-12-12 ENCOUNTER — Encounter: Payer: Self-pay | Admitting: Internal Medicine

## 2019-12-17 ENCOUNTER — Other Ambulatory Visit: Payer: Self-pay

## 2019-12-17 DIAGNOSIS — B20 Human immunodeficiency virus [HIV] disease: Secondary | ICD-10-CM

## 2019-12-17 DIAGNOSIS — Z79899 Other long term (current) drug therapy: Secondary | ICD-10-CM

## 2019-12-17 DIAGNOSIS — Z113 Encounter for screening for infections with a predominantly sexual mode of transmission: Secondary | ICD-10-CM

## 2019-12-21 ENCOUNTER — Ambulatory Visit: Payer: Self-pay

## 2019-12-21 ENCOUNTER — Other Ambulatory Visit: Payer: Self-pay

## 2019-12-21 DIAGNOSIS — B20 Human immunodeficiency virus [HIV] disease: Secondary | ICD-10-CM

## 2019-12-21 DIAGNOSIS — Z113 Encounter for screening for infections with a predominantly sexual mode of transmission: Secondary | ICD-10-CM

## 2019-12-21 DIAGNOSIS — Z79899 Other long term (current) drug therapy: Secondary | ICD-10-CM

## 2019-12-21 LAB — T-HELPER CELL (CD4) - (RCID CLINIC ONLY)
CD4 % Helper T Cell: 40 % (ref 33–65)
CD4 T Cell Abs: 804 /uL (ref 400–1790)

## 2019-12-24 LAB — COMPLETE METABOLIC PANEL WITH GFR
AG Ratio: 1.2 (calc) (ref 1.0–2.5)
ALT: 27 U/L (ref 9–46)
AST: 31 U/L (ref 10–35)
Albumin: 3.7 g/dL (ref 3.6–5.1)
Alkaline phosphatase (APISO): 38 U/L (ref 35–144)
BUN/Creatinine Ratio: 10 (calc) (ref 6–22)
BUN: 14 mg/dL (ref 7–25)
CO2: 27 mmol/L (ref 20–32)
Calcium: 8.8 mg/dL (ref 8.6–10.3)
Chloride: 103 mmol/L (ref 98–110)
Creat: 1.35 mg/dL — ABNORMAL HIGH (ref 0.70–1.33)
GFR, Est African American: 67 mL/min/{1.73_m2} (ref >=60)
GFR, Est Non African American: 58 mL/min/{1.73_m2} — ABNORMAL LOW (ref >=60)
Globulin: 3.1 g/dL (calc) (ref 1.9–3.7)
Glucose, Bld: 97 mg/dL (ref 65–99)
Potassium: 4.1 mmol/L (ref 3.5–5.3)
Sodium: 137 mmol/L (ref 135–146)
Total Bilirubin: 0.5 mg/dL (ref 0.2–1.2)
Total Protein: 6.8 g/dL (ref 6.1–8.1)

## 2019-12-24 LAB — URINE CYTOLOGY ANCILLARY ONLY
Chlamydia: NEGATIVE
Comment: NEGATIVE
Comment: NORMAL
Neisseria Gonorrhea: NEGATIVE

## 2019-12-24 LAB — LIPID PANEL
Cholesterol: 193 mg/dL (ref <200)
HDL: 54 mg/dL (ref >=40)
Non-HDL Cholesterol (Calc): 139 mg/dL (calc) — ABNORMAL HIGH (ref <130)
Total CHOL/HDL Ratio: 3.6 (calc) (ref <5.0)
Triglycerides: 430 mg/dL — ABNORMAL HIGH (ref <150)

## 2019-12-24 LAB — RPR: RPR Ser Ql: NONREACTIVE

## 2019-12-24 LAB — CBC WITH DIFFERENTIAL/PLATELET
Absolute Monocytes: 340 cells/uL (ref 200–950)
Basophils Absolute: 30 cells/uL (ref 0–200)
Basophils Relative: 0.8 %
Eosinophils Absolute: 70 cells/uL (ref 15–500)
Eosinophils Relative: 1.9 %
HCT: 35.2 % — ABNORMAL LOW (ref 38.5–50.0)
Hemoglobin: 12 g/dL — ABNORMAL LOW (ref 13.2–17.1)
Lymphs Abs: 2017 cells/uL (ref 850–3900)
MCH: 36 pg — ABNORMAL HIGH (ref 27.0–33.0)
MCHC: 34.1 g/dL (ref 32.0–36.0)
MCV: 105.7 fL — ABNORMAL HIGH (ref 80.0–100.0)
MPV: 9 fL (ref 7.5–12.5)
Monocytes Relative: 9.2 %
Neutro Abs: 1243 cells/uL — ABNORMAL LOW (ref 1500–7800)
Neutrophils Relative %: 33.6 %
Platelets: 244 10*3/uL (ref 140–400)
RBC: 3.33 10*6/uL — ABNORMAL LOW (ref 4.20–5.80)
RDW: 12.4 % (ref 11.0–15.0)
Total Lymphocyte: 54.5 %
WBC: 3.7 10*3/uL — ABNORMAL LOW (ref 3.8–10.8)

## 2019-12-24 LAB — HIV-1 RNA QUANT-NO REFLEX-BLD
HIV 1 RNA Quant: <20 copies/mL
HIV-1 RNA Quant, Log: <1.3 Log copies/mL

## 2020-01-07 ENCOUNTER — Other Ambulatory Visit: Payer: Self-pay

## 2020-01-07 ENCOUNTER — Encounter: Payer: Self-pay | Admitting: Internal Medicine

## 2020-01-07 ENCOUNTER — Ambulatory Visit (INDEPENDENT_AMBULATORY_CARE_PROVIDER_SITE_OTHER): Payer: Self-pay | Admitting: Internal Medicine

## 2020-01-07 DIAGNOSIS — B20 Human immunodeficiency virus [HIV] disease: Secondary | ICD-10-CM

## 2020-01-07 MED ORDER — GENVOYA 150-150-200-10 MG PO TABS: 1.0000 | ORAL_TABLET | Freq: Every day | ORAL | 11 refills | Status: DC

## 2020-01-07 NOTE — Progress Notes (Signed)
Patient Active Problem List   Diagnosis Date Noted  . HIV disease (HCC) 03/15/2016    Priority: High  . Dyslipidemia 09/06/2016  . Renal insufficiency 09/06/2016  . Essential hypertension 03/16/2016  . Cigarette smoker 03/16/2016  . History of depression 03/16/2016    Patient's Medications  New Prescriptions   No medications on file  Previous Medications   HYDROCHLOROTHIAZIDE (HYDRODIURIL) 12.5 MG TABLET    TAKE 1 TABLET BY MOUTH EVERY DAY   LISINOPRIL (ZESTRIL) 5 MG TABLET    TAKE 1 TABLET BY MOUTH EVERY DAY  Modified Medications   Modified Medication Previous Medication   ELVITEGRAVIR-COBICISTAT-EMTRICITABINE-TENOFOVIR (GENVOYA) 150-150-200-10 MG TABS TABLET GENVOYA 150-150-200-10 MG TABS tablet      Take 1 tablet by mouth daily with breakfast.    TAKE 1 TABLET BY MOUTH DAILY WITH BREAKFAST  Discontinued Medications   No medications on file    Subjective: Lucas Watts is in for his routine HIV follow-up visit.  He has not had any problems obtaining, taking or tolerating his Genvoya.  He takes it each morning with breakfast.  He cannot recall missing any doses.  He received 2 doses of the Pfizer Covid vaccine.  He has not gotten a PCP yet.  Review of Systems: Review of Systems  Psychiatric/Behavioral: Negative for depression.    No past medical history on file.  Social History   Tobacco Use  . Smoking status: Current Every Day Smoker    Packs/day: 0.30    Years: 43.00    Pack years: 12.90    Types: Cigarettes  . Smokeless tobacco: Never Used  . Tobacco comment: never was a big smoker  Vaping Use  . Vaping Use: Never used  Substance Use Topics  . Alcohol use: Not Currently    Alcohol/week: 21.0 standard drinks    Types: 21 Cans of beer per week  . Drug use: No    No family history on file.  No Known Allergies  Health Maintenance  Topic Date Due  . Hepatitis C Screening  Never done  . COLONOSCOPY  Never done  . INFLUENZA VACCINE  12/23/2019  .  TETANUS/TDAP  09/19/2027  . COVID-19 Vaccine  Completed  . HIV Screening  Completed    Objective:  Vitals:   01/07/20 1403  BP: 125/87  Pulse: 85  Temp: 98.3 F (36.8 C)  TempSrc: Oral  Weight: 158 lb (71.7 kg)   Body mass index is 24.02 kg/m.  Physical Exam Constitutional:      Comments: Is in good spirits.  Cardiovascular:     Rate and Rhythm: Normal rate and regular rhythm.     Heart sounds: No murmur heard.   Pulmonary:     Effort: Pulmonary effort is normal.     Breath sounds: Normal breath sounds.  Abdominal:     Palpations: Abdomen is soft.     Tenderness: There is no abdominal tenderness.  Psychiatric:        Mood and Affect: Mood normal.     Lab Results Lab Results  Component Value Date   WBC 3.7 (L) 12/21/2019   HGB 12.0 (L) 12/21/2019   HCT 35.2 (L) 12/21/2019   MCV 105.7 (H) 12/21/2019   PLT 244 12/21/2019    Lab Results  Component Value Date   CREATININE 1.35 (H) 12/21/2019   BUN 14 12/21/2019   NA 137 12/21/2019   K 4.1 12/21/2019   CL 103 12/21/2019   CO2 27 12/21/2019  Lab Results  Component Value Date   ALT 27 12/21/2019   AST 31 12/21/2019   ALKPHOS 41 06/08/2016   BILITOT 0.5 12/21/2019    Lab Results  Component Value Date   CHOL 193 12/21/2019   HDL 54 12/21/2019   LDLCALC  12/21/2019     Comment:     . LDL cholesterol not calculated. Triglyceride levels greater than 400 mg/dL invalidate calculated LDL results. . Reference range: <100 . Desirable range <100 mg/dL for primary prevention;   <70 mg/dL for patients with CHD or diabetic patients  with > or = 2 CHD risk factors. Marland Kitchen LDL-C is now calculated using the Martin-Hopkins  calculation, which is a validated novel method providing  better accuracy than the Friedewald equation in the  estimation of LDL-C.  Horald Pollen et al. Lenox Ahr. 2683;419(62): 2061-2068  (http://education.[facility].com/faq/FAQ164)    TRIG 430 (H) 12/21/2019   CHOLHDL 3.6 12/21/2019   Lab  Results  Component Value Date   LABRPR NON-REACTIVE 12/21/2019   HIV 1 RNA Quant (copies/mL)  Date Value  12/21/2019 <20 NOT DETECTED  11/21/2018 <20 NOT DETECTED  07/03/2018 <20 NOT DETECTED   CD4 T Cell Abs (/uL)  Date Value  12/21/2019 804  11/21/2018 802  07/03/2018 550     Problem List Items Addressed This Visit      High   HIV disease (HCC)    His infection remains under excellent, long-term control.  He will continue Genvoya and follow-up after lab work in 1 year.  We will help him find a PCP.      Relevant Medications   elvitegravir-cobicistat-emtricitabine-tenofovir (GENVOYA) 150-150-200-10 MG TABS tablet   Other Relevant Orders   CBC   T-helper cell (CD4)- ([facility] clinic only)   Comprehensive metabolic panel   Lipid panel   RPR   HIV-1 RNA quant-no reflex-bld        Cliffton Asters, MD [facility] for Infectious Disease [facility] [phone] pager   [phone] cell 01/07/2020, 2:24 PM

## 2020-01-07 NOTE — Assessment & Plan Note (Signed)
His infection remains under excellent, long-term control.  He will continue Genvoya and follow-up after lab work in 1 year.  We will help him find a PCP.

## 2020-02-07 ENCOUNTER — Encounter: Payer: Self-pay | Admitting: Internal Medicine

## 2020-02-26 ENCOUNTER — Other Ambulatory Visit: Payer: Self-pay | Admitting: Internal Medicine

## 2020-02-26 DIAGNOSIS — I10 Essential (primary) hypertension: Secondary | ICD-10-CM

## 2020-05-29 ENCOUNTER — Ambulatory Visit: Payer: Self-pay | Attending: Internal Medicine

## 2020-05-29 DIAGNOSIS — Z23 Encounter for immunization: Secondary | ICD-10-CM

## 2020-05-29 NOTE — Progress Notes (Signed)
   Covid-19 Vaccination Clinic  Name:  Lucas Watts    MRN: [id number] DOB: [birth year]  05/29/2020  Lucas Watts was observed post Covid-19 immunization for 15 minutes without incident. He was provided with Vaccine Information Sheet and instruction to access the V-Safe system.   Lucas Watts was instructed to call 911 with any severe reactions post vaccine: Marland Kitchen Difficulty breathing  . Swelling of face and throat  . A fast heartbeat  . A bad rash all over body  . Dizziness and weakness   Immunizations Administered    Name Date Dose VIS Date Route   Pfizer COVID-19 Vaccine 05/29/2020  1:12 PM 0.3 mL 03/12/2020 Intramuscular   Manufacturer: [employer], [employer]   Lot: [id number]   NDC: [address]-6

## 2020-08-28 ENCOUNTER — Other Ambulatory Visit: Payer: Self-pay | Admitting: Internal Medicine

## 2020-08-28 DIAGNOSIS — I10 Essential (primary) hypertension: Secondary | ICD-10-CM

## 2020-10-16 ENCOUNTER — Ambulatory Visit: Payer: 59 | Admitting: Internal Medicine

## 2020-10-16 ENCOUNTER — Other Ambulatory Visit: Payer: Self-pay

## 2020-10-16 DIAGNOSIS — G609 Hereditary and idiopathic neuropathy, unspecified: Secondary | ICD-10-CM | POA: Diagnosis not present

## 2020-10-16 DIAGNOSIS — G629 Polyneuropathy, unspecified: Secondary | ICD-10-CM | POA: Insufficient documentation

## 2020-10-16 DIAGNOSIS — I10 Essential (primary) hypertension: Secondary | ICD-10-CM

## 2020-10-16 DIAGNOSIS — N289 Disorder of kidney and ureter, unspecified: Secondary | ICD-10-CM

## 2020-10-16 DIAGNOSIS — B20 Human immunodeficiency virus [HIV] disease: Secondary | ICD-10-CM

## 2020-10-16 DIAGNOSIS — Z8659 Personal history of other mental and behavioral disorders: Secondary | ICD-10-CM

## 2020-10-16 MED ORDER — PREGABALIN 100 MG PO CAPS: 100.0000 mg | ORAL_CAPSULE | Freq: Every day | ORAL | 11 refills | Status: AC

## 2020-10-16 MED ORDER — GENVOYA 150-150-200-10 MG PO TABS: 1.0000 | ORAL_TABLET | Freq: Every day | ORAL | 11 refills | Status: DC

## 2020-10-16 NOTE — Assessment & Plan Note (Signed)
We will help him get a PCP.  Genvoya does not have any adverse effect on renal function.

## 2020-10-16 NOTE — Progress Notes (Signed)
Patient Active Problem List   Diagnosis Date Noted  . HIV disease (HCC) 03/15/2016    Priority: High  . Peripheral neuropathy 10/16/2020  . Dyslipidemia 09/06/2016  . Renal insufficiency 09/06/2016  . Essential hypertension 03/16/2016  . Cigarette smoker 03/16/2016  . History of depression 03/16/2016    Patient's Medications  New Prescriptions   PREGABALIN (LYRICA) 100 MG CAPSULE    Take 1 capsule (100 mg total) by mouth at bedtime.  Previous Medications   HYDROCHLOROTHIAZIDE (HYDRODIURIL) 12.5 MG TABLET    TAKE 1 TABLET BY MOUTH EVERY DAY   LISINOPRIL (ZESTRIL) 5 MG TABLET    TAKE 1 TABLET BY MOUTH EVERY DAY  Modified Medications   Modified Medication Previous Medication   ELVITEGRAVIR-COBICISTAT-EMTRICITABINE-TENOFOVIR (GENVOYA) 150-150-200-10 MG TABS TABLET elvitegravir-cobicistat-emtricitabine-tenofovir (GENVOYA) 150-150-200-10 MG TABS tablet      Take 1 tablet by mouth daily with breakfast.    Take 1 tablet by mouth daily with breakfast.  Discontinued Medications   No medications on file    Subjective: Lucas Watts is in for his routine HIV follow-up visit.  He denies any problems obtaining, taking or tolerating his Genvoya.  He takes it each morning with breakfast and does not recall missing doses.  He is not on any new medications since his last visit.  He still does not have a PCP.  He is feeling well other than having developed some itching, numbness and tingling in both feet over the past year.  He received his Pfizer COVID booster in January.  He denies feeling anxious or depressed.  Review of Systems: Review of Systems  Constitutional: Negative for chills, diaphoresis, fever, malaise/fatigue and weight loss.  HENT: Negative for sore throat.   Respiratory: Negative for cough, sputum production and shortness of breath.   Cardiovascular: Negative for chest pain.  Gastrointestinal: Negative for abdominal pain, diarrhea, heartburn, nausea and vomiting.   Genitourinary: Negative for dysuria and frequency.  Musculoskeletal: Negative for joint pain and myalgias.  Skin: Negative for rash.  Neurological: Positive for sensory change. Negative for focal weakness.  Psychiatric/Behavioral: Negative for depression. The patient is not nervous/anxious.     No past medical history on file.  Social History   Tobacco Use  . Smoking status: Current Every Day Smoker    Packs/day: 0.30    Years: 43.00    Pack years: 12.90    Types: Cigarettes  . Smokeless tobacco: Never Used  . Tobacco comment: never was a big smoker  Vaping Use  . Vaping Use: Never used  Substance Use Topics  . Alcohol use: Not Currently    Alcohol/week: 21.0 standard drinks    Types: 21 Cans of beer per week  . Drug use: No    No family history on file.  No Known Allergies  Health Maintenance  Topic Date Due  . Hepatitis C Screening  Never done  . COLONOSCOPY (Pts 45-68yrs Insurance coverage will need to be confirmed)  Never done  . COVID-19 Vaccine (4 - Booster for Pfizer series) 08/27/2020  . INFLUENZA VACCINE  12/22/2020  . TETANUS/TDAP  09/19/2027  . HIV Screening  Completed  . HPV VACCINES  Aged Out    Objective:  Vitals:   10/16/20 0949  BP: 105/72  Pulse: 84  Temp: (!) 97.5 F (36.4 C)  SpO2: 98%  Weight: 145 lb (65.8 kg)   Body mass index is 22.05 kg/m.  Physical Exam Constitutional:      Comments: He is  in good spirits.  Cardiovascular:     Rate and Rhythm: Normal rate and regular rhythm.     Heart sounds: No murmur heard.   Pulmonary:     Effort: Pulmonary effort is normal.     Breath sounds: Normal breath sounds.  Abdominal:     Palpations: Abdomen is soft.     Tenderness: There is no abdominal tenderness.  Musculoskeletal:     Comments: His feet are warm and well-perfused.  He has good pulses in both feet.  Skin:    Findings: No rash.  Neurological:     General: No focal deficit present.  Psychiatric:        Mood and Affect:  Mood normal.     Lab Results Lab Results  Component Value Date   WBC 3.7 (L) 12/21/2019   HGB 12.0 (L) 12/21/2019   HCT 35.2 (L) 12/21/2019   MCV 105.7 (H) 12/21/2019   PLT 244 12/21/2019    Lab Results  Component Value Date   CREATININE 1.35 (H) 12/21/2019   BUN 14 12/21/2019   NA 137 12/21/2019   K 4.1 12/21/2019   CL 103 12/21/2019   CO2 27 12/21/2019    Lab Results  Component Value Date   ALT 27 12/21/2019   AST 31 12/21/2019   ALKPHOS 41 06/08/2016   BILITOT 0.5 12/21/2019    Lab Results  Component Value Date   CHOL 193 12/21/2019   HDL 54 12/21/2019   LDLCALC  12/21/2019     Comment:     . LDL cholesterol not calculated. Triglyceride levels greater than 400 mg/dL invalidate calculated LDL results. . Reference range: <100 . Desirable range <100 mg/dL for primary prevention;   <70 mg/dL for patients with CHD or diabetic patients  with > or = 2 CHD risk factors. Marland Kitchen LDL-C is now calculated using the Martin-Hopkins  calculation, which is a validated novel method providing  better accuracy than the Friedewald equation in the  estimation of LDL-C.  Horald Pollen et al. Lenox Ahr. 1610;960(45): 2061-2068  (http://education.[facility].com/faq/FAQ164)    TRIG 430 (H) 12/21/2019   CHOLHDL 3.6 12/21/2019   Lab Results  Component Value Date   LABRPR NON-REACTIVE 12/21/2019   HIV 1 RNA Quant (copies/mL)  Date Value  12/21/2019 <20 NOT DETECTED  11/21/2018 <20 NOT DETECTED  07/03/2018 <20 NOT DETECTED   CD4 T Cell Abs (/uL)  Date Value  12/21/2019 804  11/21/2018 802  07/03/2018 550     Problem List Items Addressed This Visit      High   HIV disease (HCC)    His infection has been under excellent, long-term control.  He will get repeat blood work today.  He will continue Genvoya and follow-up in 3 months.      Relevant Medications   elvitegravir-cobicistat-emtricitabine-tenofovir (GENVOYA) 150-150-200-10 MG TABS tablet   Other Relevant Orders    T-helper cell (CD4)- ([facility] clinic only)   HIV-1 RNA quant-no reflex-bld   CBC   Comprehensive metabolic panel   RPR   Lipid panel     Unprioritized   Essential hypertension    His blood pressure is at goal.  We will help him get a PCP.      History of depression    His depression is in remission.      Renal insufficiency    We will help him get a PCP.  Genvoya does not have any adverse effect on renal function.      Peripheral neuropathy  I will give him a trial of pregabalin taken at bedtime.      Relevant Medications   pregabalin (LYRICA) 100 MG capsule        Cliffton Asters, MD [facility] for Infectious Disease [facility] [phone] pager   [phone] cell 10/16/2020, 10:15 AM

## 2020-10-16 NOTE — Assessment & Plan Note (Signed)
His depression is in remission. 

## 2020-10-16 NOTE — Assessment & Plan Note (Signed)
I will give him a trial of pregabalin taken at bedtime.

## 2020-10-16 NOTE — Assessment & Plan Note (Signed)
His blood pressure is at goal.  We will help him get a PCP.

## 2020-10-16 NOTE — Assessment & Plan Note (Signed)
His infection has been under excellent, long-term control.  He will get repeat blood work today.  He will continue Genvoya and follow-up in 3 months.

## 2020-10-17 LAB — T-HELPER CELL (CD4) - (RCID CLINIC ONLY)
CD4 % Helper T Cell: 40 % (ref 33–65)
CD4 T Cell Abs: 549 /uL (ref 400–1790)

## 2020-10-20 LAB — LIPID PANEL
Cholesterol: 316 mg/dL — ABNORMAL HIGH (ref <200)
HDL: 41 mg/dL (ref >=40)
Non-HDL Cholesterol (Calc): 275 mg/dL (calc) — ABNORMAL HIGH (ref <130)
Total CHOL/HDL Ratio: 7.7 (calc) — ABNORMAL HIGH (ref <5.0)
Triglycerides: 1717 mg/dL — ABNORMAL HIGH (ref <150)

## 2020-10-20 LAB — COMPREHENSIVE METABOLIC PANEL
AG Ratio: 1.2 (calc) (ref 1.0–2.5)
ALT: 64 U/L — ABNORMAL HIGH (ref 9–46)
AST: 82 U/L — ABNORMAL HIGH (ref 10–35)
Albumin: 3.7 g/dL (ref 3.6–5.1)
Alkaline phosphatase (APISO): 47 U/L (ref 35–144)
BUN/Creatinine Ratio: 13 (calc) (ref 6–22)
BUN: 19 mg/dL (ref 7–25)
CO2: 26 mmol/L (ref 20–32)
Calcium: 9 mg/dL (ref 8.6–10.3)
Chloride: 99 mmol/L (ref 98–110)
Creat: 1.46 mg/dL — ABNORMAL HIGH (ref 0.70–1.33)
Globulin: 3 g/dL (calc) (ref 1.9–3.7)
Glucose, Bld: 96 mg/dL (ref 65–99)
Potassium: 4 mmol/L (ref 3.5–5.3)
Sodium: 134 mmol/L — ABNORMAL LOW (ref 135–146)
Total Bilirubin: 0.8 mg/dL (ref 0.2–1.2)
Total Protein: 6.7 g/dL (ref 6.1–8.1)

## 2020-10-20 LAB — CBC
HCT: 36.2 % — ABNORMAL LOW (ref 38.5–50.0)
Hemoglobin: 12.8 g/dL — ABNORMAL LOW (ref 13.2–17.1)
MCH: 39.5 pg — ABNORMAL HIGH (ref 27.0–33.0)
MCHC: 35.4 g/dL (ref 32.0–36.0)
MCV: 111.7 fL — ABNORMAL HIGH (ref 80.0–100.0)
MPV: 8.8 fL (ref 7.5–12.5)
Platelets: 218 10*3/uL (ref 140–400)
RBC: 3.24 10*6/uL — ABNORMAL LOW (ref 4.20–5.80)
RDW: 13.4 % (ref 11.0–15.0)
WBC: 3.4 10*3/uL — ABNORMAL LOW (ref 3.8–10.8)

## 2020-10-20 LAB — HIV-1 RNA QUANT-NO REFLEX-BLD
HIV 1 RNA Quant: NOT DETECTED Copies/mL
HIV-1 RNA Quant, Log: NOT DETECTED Log cps/mL

## 2020-10-20 LAB — RPR: RPR Ser Ql: NONREACTIVE

## 2020-10-23 ENCOUNTER — Telehealth: Payer: Self-pay

## 2020-10-23 NOTE — Telephone Encounter (Signed)
-----   Message from Cliffton Asters, MD sent at 10/23/2020 12:18 PM EDT ----- Please follow-up with [facility] to see if he has been able to get a PCP.  His triglycerides are extremely elevated and he needs evaluation and treatment as soon as possible.

## 2020-12-22 ENCOUNTER — Other Ambulatory Visit: Payer: 59

## 2020-12-22 ENCOUNTER — Other Ambulatory Visit: Payer: Self-pay

## 2020-12-22 DIAGNOSIS — B20 Human immunodeficiency virus [HIV] disease: Secondary | ICD-10-CM

## 2020-12-23 LAB — T-HELPER CELL (CD4) - (RCID CLINIC ONLY)
CD4 % Helper T Cell: 40 % (ref 33–65)
CD4 T Cell Abs: 696 /uL (ref 400–1790)

## 2020-12-24 LAB — COMPREHENSIVE METABOLIC PANEL
AG Ratio: 1.4 (calc) (ref 1.0–2.5)
ALT: 17 U/L (ref 9–46)
AST: 21 U/L (ref 10–35)
Albumin: 3.7 g/dL (ref 3.6–5.1)
Alkaline phosphatase (APISO): 35 U/L (ref 35–144)
BUN/Creatinine Ratio: 11 (calc) (ref 6–22)
BUN: 15 mg/dL (ref 7–25)
CO2: 25 mmol/L (ref 20–32)
Calcium: 8.5 mg/dL — ABNORMAL LOW (ref 8.6–10.3)
Chloride: 107 mmol/L (ref 98–110)
Creat: 1.32 mg/dL — ABNORMAL HIGH (ref 0.70–1.30)
Globulin: 2.7 g/dL (calc) (ref 1.9–3.7)
Glucose, Bld: 82 mg/dL (ref 65–99)
Potassium: 4.4 mmol/L (ref 3.5–5.3)
Sodium: 139 mmol/L (ref 135–146)
Total Bilirubin: 0.4 mg/dL (ref 0.2–1.2)
Total Protein: 6.4 g/dL (ref 6.1–8.1)

## 2020-12-24 LAB — RPR: RPR Ser Ql: NONREACTIVE

## 2020-12-24 LAB — CBC
HCT: 34.9 % — ABNORMAL LOW (ref 38.5–50.0)
Hemoglobin: 12.1 g/dL — ABNORMAL LOW (ref 13.2–17.1)
MCH: 38.8 pg — ABNORMAL HIGH (ref 27.0–33.0)
MCHC: 34.7 g/dL (ref 32.0–36.0)
MCV: 111.9 fL — ABNORMAL HIGH (ref 80.0–100.0)
MPV: 8.6 fL (ref 7.5–12.5)
Platelets: 280 10*3/uL (ref 140–400)
RBC: 3.12 10*6/uL — ABNORMAL LOW (ref 4.20–5.80)
RDW: 12.6 % (ref 11.0–15.0)
WBC: 3.3 10*3/uL — ABNORMAL LOW (ref 3.8–10.8)

## 2020-12-24 LAB — LIPID PANEL
Cholesterol: 228 mg/dL — ABNORMAL HIGH (ref <200)
HDL: 72 mg/dL (ref >=40)
LDL Cholesterol (Calc): 118 mg/dL (calc) — ABNORMAL HIGH
Non-HDL Cholesterol (Calc): 156 mg/dL (calc) — ABNORMAL HIGH (ref <130)
Total CHOL/HDL Ratio: 3.2 (calc) (ref <5.0)
Triglycerides: 263 mg/dL — ABNORMAL HIGH (ref <150)

## 2020-12-24 LAB — HIV-1 RNA QUANT-NO REFLEX-BLD
HIV 1 RNA Quant: NOT DETECTED Copies/mL
HIV-1 RNA Quant, Log: NOT DETECTED Log cps/mL

## 2020-12-29 ENCOUNTER — Ambulatory Visit: Payer: 59 | Admitting: Medical

## 2021-01-07 ENCOUNTER — Encounter: Payer: Self-pay | Admitting: Internal Medicine

## 2021-01-07 ENCOUNTER — Other Ambulatory Visit: Payer: Self-pay

## 2021-01-07 ENCOUNTER — Ambulatory Visit (INDEPENDENT_AMBULATORY_CARE_PROVIDER_SITE_OTHER): Payer: 59 | Admitting: Internal Medicine

## 2021-01-07 DIAGNOSIS — F17210 Nicotine dependence, cigarettes, uncomplicated: Secondary | ICD-10-CM

## 2021-01-07 DIAGNOSIS — I10 Essential (primary) hypertension: Secondary | ICD-10-CM

## 2021-01-07 DIAGNOSIS — E785 Hyperlipidemia, unspecified: Secondary | ICD-10-CM | POA: Diagnosis not present

## 2021-01-07 DIAGNOSIS — Z8659 Personal history of other mental and behavioral disorders: Secondary | ICD-10-CM

## 2021-01-07 DIAGNOSIS — B20 Human immunodeficiency virus [HIV] disease: Secondary | ICD-10-CM

## 2021-01-07 DIAGNOSIS — N289 Disorder of kidney and ureter, unspecified: Secondary | ICD-10-CM

## 2021-01-07 MED ORDER — GENVOYA 150-150-200-10 MG PO TABS: 1.0000 | ORAL_TABLET | Freq: Every day | ORAL | 11 refills | Status: DC

## 2021-01-07 NOTE — Assessment & Plan Note (Signed)
His depression is in remission. 

## 2021-01-07 NOTE — Assessment & Plan Note (Signed)
He continues to have excellent, long-term control of his infection.  He will continue Genvoya and follow-up after lab work in 1 year.  I encouraged him to go ahead and get his second COVID booster vaccine.

## 2021-01-07 NOTE — Assessment & Plan Note (Signed)
Renal insufficiency is stable.  Genvoya does not have any adverse impact on renal function.

## 2021-01-07 NOTE — Assessment & Plan Note (Signed)
His blood pressure is just above goal after stopping hydrochlorothiazide recently.

## 2021-01-07 NOTE — Progress Notes (Signed)
Patient Active Problem List   Diagnosis Date Noted   HIV disease (HCC) 03/15/2016    Priority: High   Peripheral neuropathy 10/16/2020   Dyslipidemia 09/06/2016   Renal insufficiency 09/06/2016   Essential hypertension 03/16/2016   Cigarette smoker 03/16/2016   History of depression 03/16/2016    Patient's Medications  New Prescriptions   No medications on file  Previous Medications   HYDROCHLOROTHIAZIDE (HYDRODIURIL) 12.5 MG TABLET    TAKE 1 TABLET BY MOUTH EVERY DAY   LISINOPRIL (ZESTRIL) 5 MG TABLET    TAKE 1 TABLET BY MOUTH EVERY DAY   PREGABALIN (LYRICA) 100 MG CAPSULE    Take 1 capsule (100 mg total) by mouth at bedtime.  Modified Medications   Modified Medication Previous Medication   ELVITEGRAVIR-COBICISTAT-EMTRICITABINE-TENOFOVIR (GENVOYA) 150-150-200-10 MG TABS TABLET elvitegravir-cobicistat-emtricitabine-tenofovir (GENVOYA) 150-150-200-10 MG TABS tablet      Take 1 tablet by mouth daily with breakfast.    Take 1 tablet by mouth daily with breakfast.  Discontinued Medications   No medications on file    Subjective: Lucas Watts is in for his routine HIV follow-up visit.  He denies any problems obtaining, taking or tolerating his Genvoya.  He takes it each morning with breakfast and does not recall missing any doses.  His PCP recently stopped his hydrochlorothiazide and that his blood pressure was low.  He was asymptomatic.  He has not had any other changes in his medications.  He had his first COVID booster vaccine in January of this year.  He is still smoking cigarettes and has no current plans to quit.  He denies feeling anxious or depressed.  Review of Systems: Review of Systems  Constitutional:  Negative for fever and weight loss.  Respiratory:  Negative for cough.   Cardiovascular:  Negative for chest pain.  Psychiatric/Behavioral:  Negative for depression. The patient is not nervous/anxious.    No past medical history on file.  Social History    Tobacco Use   Smoking status: Every Day    Packs/day: 0.30    Years: 43.00    Pack years: 12.90    Types: Cigarettes   Smokeless tobacco: Never   Tobacco comments:    never was a big smoker  Vaping Use   Vaping Use: Never used  Substance Use Topics   Alcohol use: Not Currently    Alcohol/week: 21.0 standard drinks    Types: 21 Cans of beer per week   Drug use: No    No family history on file.  No Known Allergies  Health Maintenance  Topic Date Due   Hepatitis C Screening  Never done   Zoster Vaccines- Shingrix (1 of 2) Never done   COLONOSCOPY (Pts 45-51yrs Insurance coverage will need to be confirmed)  Never done   Pneumococcal Vaccine 17-32 Years old (2 - PPSV23 or PCV20) 12/24/2017   COVID-19 Vaccine (4 - Booster for Pfizer series) 08/27/2020   INFLUENZA VACCINE  12/22/2020   TETANUS/TDAP  09/19/2027   HIV Screening  Completed   HPV VACCINES  Aged Out    Objective:  Vitals:   01/07/21 0945  BP: 130/85  Pulse: 66  Temp: 98 F (36.7 C)  TempSrc: Oral  SpO2: 97%  Weight: 156 lb 3.2 oz (70.9 kg)   Body mass index is 23.75 kg/m.  Physical Exam Constitutional:      Comments: He is talkative and in good spirits.  Cardiovascular:     Rate and Rhythm: Normal  rate and regular rhythm.     Heart sounds: No murmur heard. Pulmonary:     Effort: Pulmonary effort is normal.     Breath sounds: Normal breath sounds.  Skin:    Findings: No rash.  Psychiatric:        Mood and Affect: Mood normal.    Lab Results Lab Results  Component Value Date   WBC 3.3 (L) 12/22/2020   HGB 12.1 (L) 12/22/2020   HCT 34.9 (L) 12/22/2020   MCV 111.9 (H) 12/22/2020   PLT 280 12/22/2020    Lab Results  Component Value Date   CREATININE 1.32 (H) 12/22/2020   BUN 15 12/22/2020   NA 139 12/22/2020   K 4.4 12/22/2020   CL 107 12/22/2020   CO2 25 12/22/2020    Lab Results  Component Value Date   ALT 17 12/22/2020   AST 21 12/22/2020   ALKPHOS 41 06/08/2016   BILITOT  0.4 12/22/2020    Lab Results  Component Value Date   CHOL 228 (H) 12/22/2020   HDL 72 12/22/2020   LDLCALC 118 (H) 12/22/2020   TRIG 263 (H) 12/22/2020   CHOLHDL 3.2 12/22/2020   Lab Results  Component Value Date   LABRPR NON-REACTIVE 12/22/2020   HIV 1 RNA Quant  Date Value  12/22/2020 Not Detected Copies/mL  10/16/2020 Not Detected Copies/mL  12/21/2019 <20 NOT DETECTED copies/mL   CD4 T Cell Abs (/uL)  Date Value  12/22/2020 696  10/16/2020 549  12/21/2019 804     Problem List Items Addressed This Visit       High   HIV disease (HCC)    He continues to have excellent, long-term control of his infection.  He will continue Genvoya and follow-up after lab work in 1 year.  I encouraged him to go ahead and get his second COVID booster vaccine.      Relevant Medications   elvitegravir-cobicistat-emtricitabine-tenofovir (GENVOYA) 150-150-200-10 MG TABS tablet   Other Relevant Orders   CBC   T-helper cell (CD4)- ([facility] clinic only)   Comprehensive metabolic panel   Lipid panel   RPR   HIV-1 RNA quant-no reflex-bld     Unprioritized   Essential hypertension    His blood pressure is just above goal after stopping hydrochlorothiazide recently.      Cigarette smoker    I encouraged him to consider cutting down and trying to quit smoking.      History of depression    His depression is in remission.      Dyslipidemia    His triglyceride level has come down significantly since May.  He says he has not made any changes in his diet.      Renal insufficiency    Renal insufficiency is stable.  Genvoya does not have any adverse impact on renal function.         Cliffton Asters, MD [facility] for Infectious Disease [facility] [phone] pager   [phone] cell 01/07/2021, 10:03 AM

## 2021-01-07 NOTE — Assessment & Plan Note (Signed)
I encouraged him to consider cutting down and trying to quit smoking. 

## 2021-01-07 NOTE — Assessment & Plan Note (Signed)
His triglyceride level has come down significantly since May.  He says he has not made any changes in his diet.

## 2021-01-12 ENCOUNTER — Telehealth: Payer: Self-pay

## 2021-01-12 NOTE — Telephone Encounter (Signed)
Patient called stating he saw his pcp last week and was told he has high cholesterol. Patient stated he was not sure if his pcp was going to manage his cholesterol due to him being on Genovya and not knowing what medication to put him on. I have reached out to patient's pcp to clarify if they were going to manage his cholesterol. Awaiting for a call back.

## 2021-03-12 ENCOUNTER — Other Ambulatory Visit: Payer: Self-pay | Admitting: Internal Medicine

## 2021-03-12 DIAGNOSIS — I10 Essential (primary) hypertension: Secondary | ICD-10-CM

## 2021-04-15 ENCOUNTER — Telehealth: Payer: Self-pay

## 2021-04-15 NOTE — Telephone Encounter (Signed)
Patient called and said he needs a new PCP. States he currently has one that he saw today, but that he no longer wants to see her. Patient has insurance through [place], RN attempted to explain process of finding a new PCP with patient, but he became upset saying that he just needs his lisinopril.   He says his PCP sent it to a pharmacy in [place] and he does not live there. Recommended he reach out to his PCP and have her send it to a different pharmacy. He then became adamant that he needs an appointment with Dr. Orvan Falconer to talk about getting his medication. Explained that Dr. Orvan Falconer is managing his Darrin Luis and that he should reach out to PCP for BP medications.   He says Dr. Orvan Falconer used to fill these for him. RN expressed understanding and again tried to assist him with contacting PCP. He declines and would like appointment with our office. Scheduled for 11/29.  Sandie Ano, RN

## 2021-04-21 ENCOUNTER — Ambulatory Visit (INDEPENDENT_AMBULATORY_CARE_PROVIDER_SITE_OTHER): Payer: 59 | Admitting: Internal Medicine

## 2021-04-21 ENCOUNTER — Other Ambulatory Visit: Payer: Self-pay

## 2021-04-21 ENCOUNTER — Encounter: Payer: Self-pay | Admitting: Internal Medicine

## 2021-04-21 DIAGNOSIS — B20 Human immunodeficiency virus [HIV] disease: Secondary | ICD-10-CM | POA: Diagnosis not present

## 2021-04-21 DIAGNOSIS — I10 Essential (primary) hypertension: Secondary | ICD-10-CM | POA: Diagnosis not present

## 2021-04-21 DIAGNOSIS — F17210 Nicotine dependence, cigarettes, uncomplicated: Secondary | ICD-10-CM | POA: Diagnosis not present

## 2021-04-21 MED ORDER — LISINOPRIL 5 MG PO TABS: 5.0000 mg | ORAL_TABLET | Freq: Every day | ORAL | 5 refills | Status: DC

## 2021-04-21 NOTE — Progress Notes (Signed)
Patient Active Problem List   Diagnosis Date Noted   HIV disease (HCC) 03/15/2016    Priority: High   Peripheral neuropathy 10/16/2020   Dyslipidemia 09/06/2016   Renal insufficiency 09/06/2016   Essential hypertension 03/16/2016   Cigarette smoker 03/16/2016   History of depression 03/16/2016    Patient's Medications  New Prescriptions   No medications on file  Previous Medications   ELVITEGRAVIR-COBICISTAT-EMTRICITABINE-TENOFOVIR (GENVOYA) 150-150-200-10 MG TABS TABLET    Take 1 tablet by mouth daily with breakfast.   HYDROCHLOROTHIAZIDE (HYDRODIURIL) 12.5 MG TABLET    TAKE 1 TABLET BY MOUTH EVERY DAY   PREGABALIN (LYRICA) 100 MG CAPSULE    Take 1 capsule (100 mg total) by mouth at bedtime.  Modified Medications   Modified Medication Previous Medication   LISINOPRIL (ZESTRIL) 5 MG TABLET lisinopril (ZESTRIL) 5 MG tablet      Take 1 tablet (5 mg total) by mouth daily.    TAKE 1 TABLET BY MOUTH EVERY DAY  Discontinued Medications   No medications on file    Subjective: Lucas Watts is in for a work in visit.  He says that he wants to get a new PCP.  He says that he does not relate well to the PCP that he has been seeing over the past 5 months.  He is also out of his lisinopril.  He has not had any problems obtaining, taking or tolerating his Genvoya and has not missed doses.  He has gotten an annual influenza vaccine and a COVID booster vaccine since his appointment here 3 months ago.  He says that he has been cutting down on his cigarettes and is now only smoking about every other day.  Review of Systems: Review of Systems  Constitutional:  Negative for chills, diaphoresis, fever, malaise/fatigue and weight loss.  HENT:  Negative for sore throat.   Respiratory:  Negative for cough, sputum production and shortness of breath.   Cardiovascular:  Negative for chest pain.  Gastrointestinal:  Negative for abdominal pain, diarrhea, heartburn, nausea and vomiting.   Genitourinary:  Negative for dysuria and frequency.  Musculoskeletal:  Negative for joint pain and myalgias.  Skin:  Negative for rash.  Neurological:  Negative for dizziness and headaches.  Psychiatric/Behavioral:  Negative for depression and substance abuse. The patient is not nervous/anxious.    No past medical history on file.  Social History   Tobacco Use   Smoking status: Every Day    Packs/day: 0.30    Years: 43.00    Pack years: 12.90    Types: Cigarettes   Smokeless tobacco: Never   Tobacco comments:    never was a big smoker  Vaping Use   Vaping Use: Never used  Substance Use Topics   Alcohol use: Not Currently    Alcohol/week: 21.0 standard drinks    Types: 21 Cans of beer per week   Drug use: No    No family history on file.  No Known Allergies  Health Maintenance  Topic Date Due   Hepatitis C Screening  Never done   Zoster Vaccines- Shingrix (1 of 2) Never done   COLONOSCOPY (Pts 45-36yrs Insurance coverage will need to be confirmed)  Never done   Pneumococcal Vaccine 80-65 Years old (2 - PPSV23 if available, else PCV20) 12/24/2017   COVID-19 Vaccine (4 - Booster for Pfizer series) 07/24/2020   TETANUS/TDAP  09/19/2027   INFLUENZA VACCINE  Completed   HIV Screening  Completed  HPV VACCINES  Aged Out    Objective:  Vitals:   04/21/21 1512  BP: 123/83  Pulse: 84  Resp: 16  Temp: 98 F (36.7 C)  TempSrc: Temporal  SpO2: 98%  Weight: 157 lb (71.2 kg)  Height: 5\' 8"  (1.727 m)   Body mass index is 23.87 kg/m.  Physical Exam Constitutional:      Comments: His spirits are good.  Cardiovascular:     Rate and Rhythm: Normal rate.  Pulmonary:     Effort: Pulmonary effort is normal.  Psychiatric:        Mood and Affect: Mood normal.    Lab Results Lab Results  Component Value Date   WBC 3.3 (L) 12/22/2020   HGB 12.1 (L) 12/22/2020   HCT 34.9 (L) 12/22/2020   MCV 111.9 (H) 12/22/2020   PLT 280 12/22/2020    Lab Results  Component  Value Date   CREATININE 1.32 (H) 12/22/2020   BUN 15 12/22/2020   NA 139 12/22/2020   K 4.4 12/22/2020   CL 107 12/22/2020   CO2 25 12/22/2020    Lab Results  Component Value Date   ALT 17 12/22/2020   AST 21 12/22/2020   ALKPHOS 41 06/08/2016   BILITOT 0.4 12/22/2020    Lab Results  Component Value Date   CHOL 228 (H) 12/22/2020   HDL 72 12/22/2020   LDLCALC 118 (H) 12/22/2020   TRIG 263 (H) 12/22/2020   CHOLHDL 3.2 12/22/2020   Lab Results  Component Value Date   LABRPR NON-REACTIVE 12/22/2020   HIV 1 RNA Quant  Date Value  12/22/2020 Not Detected Copies/mL  10/16/2020 Not Detected Copies/mL  12/21/2019 <20 NOT DETECTED copies/mL   CD4 T Cell Abs (/uL)  Date Value  12/22/2020 696  10/16/2020 549  12/21/2019 804     Problem List Items Addressed This Visit       High   HIV disease (HCC)    His infection remains under excellent, long-term control.  He will continue Genvoya and follow-up next August after lab work.        Unprioritized   Essential hypertension    I refilled his lisinopril.  We will help him get a new PCP.      Relevant Medications   lisinopril (ZESTRIL) 5 MG tablet   Cigarette smoker    I encouraged him to continue his efforts to cut down and eventually quit smoking cigarettes completely.         September, MD [facility] for Infectious Disease [facility] [phone] pager   [phone] cell 04/21/2021, 3:32 PM

## 2021-04-21 NOTE — Assessment & Plan Note (Signed)
His infection remains under excellent, long-term control.  He will continue Genvoya and follow-up next August after lab work.

## 2021-04-21 NOTE — Assessment & Plan Note (Signed)
I encouraged him to continue his efforts to cut down and eventually quit smoking cigarettes completely.

## 2021-04-21 NOTE — Assessment & Plan Note (Signed)
I refilled his lisinopril.  We will help him get a new PCP.

## 2021-04-30 ENCOUNTER — Encounter: Payer: Self-pay | Admitting: Internal Medicine

## 2021-05-14 ENCOUNTER — Other Ambulatory Visit: Payer: Self-pay | Admitting: Internal Medicine

## 2021-05-14 DIAGNOSIS — G609 Hereditary and idiopathic neuropathy, unspecified: Secondary | ICD-10-CM

## 2021-06-12 ENCOUNTER — Other Ambulatory Visit: Payer: Self-pay

## 2021-06-12 DIAGNOSIS — G609 Hereditary and idiopathic neuropathy, unspecified: Secondary | ICD-10-CM

## 2021-06-18 ENCOUNTER — Ambulatory Visit: Payer: 59 | Admitting: Medical

## 2021-06-24 ENCOUNTER — Encounter: Payer: Self-pay | Admitting: Medical

## 2021-06-24 ENCOUNTER — Ambulatory Visit (INDEPENDENT_AMBULATORY_CARE_PROVIDER_SITE_OTHER): Payer: Self-pay | Admitting: Medical

## 2021-06-24 VITALS — BP 140/90 | HR 94 | Temp 98.2°F | Resp 18 | Ht 68.0 in | Wt 159.8 lb

## 2021-06-24 DIAGNOSIS — B20 Human immunodeficiency virus [HIV] disease: Secondary | ICD-10-CM

## 2021-06-24 DIAGNOSIS — G629 Polyneuropathy, unspecified: Secondary | ICD-10-CM

## 2021-06-24 DIAGNOSIS — Z0000 Encounter for general adult medical examination without abnormal findings: Secondary | ICD-10-CM

## 2021-06-24 DIAGNOSIS — I10 Essential (primary) hypertension: Secondary | ICD-10-CM

## 2021-06-24 DIAGNOSIS — Z1211 Encounter for screening for malignant neoplasm of colon: Secondary | ICD-10-CM

## 2021-06-24 DIAGNOSIS — E785 Hyperlipidemia, unspecified: Secondary | ICD-10-CM

## 2021-06-24 DIAGNOSIS — F17210 Nicotine dependence, cigarettes, uncomplicated: Secondary | ICD-10-CM

## 2021-06-24 DIAGNOSIS — Z125 Encounter for screening for malignant neoplasm of prostate: Secondary | ICD-10-CM

## 2021-06-24 NOTE — Progress Notes (Signed)
Subjective:    Patient ID: Lucas Watts, male    DOB: 03-04-1961, 61 y.o.   MRN: [id number]  HPI  Pt in for first time.  New pt and decided to go ahead and do wellness exam.  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 Pt does not exercise apart from work. States he eats healthy. Smokes pack of cigarettes a week presently. Started when he was 14 yr approximate. Overall 1/4 pack a day. Alcohol about 4 beers a day. He states pretty consistent use as his parents would give him some alcohol in baby bottle ate age of 47 yo.  Pt never had colonoscopy. Will refer today.  No psa/screening seen on review of chart.   Htn- bp high on check initially. On lisinopril 5 mg daily. Pt does not check bp at home.  Hiv- sees ID and on genvoya.  Neuropathy of left foot- pt on lyrica. Rx'd by Dr. Orvan Falconer.    Review of Systems  Constitutional:  Negative for chills, fatigue and fever.  Respiratory:  Negative for cough, chest tightness, shortness of breath and wheezing.   Cardiovascular:  Negative for chest pain and palpitations.  Gastrointestinal:  Negative for abdominal distention, abdominal pain, diarrhea, nausea, rectal pain and vomiting.  Endocrine: Negative for polyphagia.  Genitourinary:  Negative for dysuria, flank pain, frequency and urgency.  Musculoskeletal:  Negative for back pain, joint swelling and myalgias.  Neurological:  Negative for dizziness, syncope, weakness, light-headedness and numbness.  Hematological:  Negative for adenopathy. Does not bruise/bleed easily.  Psychiatric/Behavioral:  Negative for behavioral problems, confusion and decreased concentration.     Past Medical History:  Diagnosis Date   HIV infection (HCC)      Social History   Socioeconomic History   Marital status: Married    Spouse name: Not on file   Number of children: Not on file   Years of education: Not on file   Highest education level: Not on file  Occupational History   Not on file  Tobacco  Use   Smoking status: Every Day    Packs/day: 0.25    Years: 40.00    Pack years: 10.00    Types: Cigarettes   Smokeless tobacco: Never   Tobacco comments:    never was a big smoker  Vaping Use   Vaping Use: Never used  Substance and Sexual Activity   Alcohol use: Not Currently    Alcohol/week: 28.0 standard drinks    Types: 28 Cans of beer per week    Comment: 4 beers a day.   Drug use: No   Sexual activity: Yes    Partners: Male    Birth control/protection: Condom    Comment: pt declined condoms   Other Topics Concern   Not on file  Social History Narrative   Not on file   Social Determinants of Health   Financial Resource Strain: Not on file  Food Insecurity: Not on file  Transportation Needs: Not on file  Physical Activity: Not on file  Stress: Not on file  Social Connections: Not on file  Intimate Partner Violence: Not on file    Past Surgical History:  Procedure Laterality Date   BACK SURGERY     l4-l5 procedure.   left ankle surgery Left     History reviewed. No pertinent family history.  No Known Allergies  Current Outpatient Medications on File Prior to Visit  Medication Sig Dispense Refill   elvitegravir-cobicistat-emtricitabine-tenofovir (GENVOYA) 150-150-200-10 MG TABS tablet Take 1 tablet by  mouth daily with breakfast. 30 tablet 11   lisinopril (ZESTRIL) 5 MG tablet Take 1 tablet (5 mg total) by mouth daily. 30 tablet 5   pregabalin (LYRICA) 100 MG capsule Take 1 capsule (100 mg total) by mouth at bedtime. 30 capsule 11   No current facility-administered medications on file prior to visit.    BP (!) 150/90    Pulse 94    Temp 98.2 F (36.8 C)    Resp 18    Ht 5\' 8"  (1.727 m)    Wt 159 lb 12.8 oz (72.5 kg)    SpO2 99%    BMI 24.30 kg/m       Objective:   Physical Exam  General Mental Status- Alert. General Appearance- Not in acute distress.   Skin General: Color- Normal Color. Moisture- Normal Moisture.  Neck Carotid Arteries- Normal  color. Moisture- Normal Moisture. No carotid bruits. No JVD.  Chest and Lung Exam Auscultation: Breath Sounds:-Normal.  Cardiovascular Auscultation:Rythm- Regular. Murmurs & Other Heart Sounds:Auscultation of the heart reveals- No Murmurs.  Abdomen Inspection:-Inspeection Normal. Palpation/Percussion:Note:No mass. Palpation and Percussion of the abdomen reveal- Non Tender, Non Distended + BS, no rebound or guarding.   Neurologic Cranial Nerve exam:- CN III-XII intact(No nystagmus), symmetric smile. Strength:- 5/5 equal and symmetric strength both upper and lower extremities.       Assessment & Plan:   Patient Instructions  For you wellness exam today I have ordered cbc, cmp, lipid panel and psa.  Discussed shingles vaccine. You decided would check with insurance to see if covered before we give.  Recommend exercise and healthy diet.  We will let you know lab results as they come in.  Follow up date appointment will be determined after lab review.    Your bp is borderline today but did come down and better when you are at ID office. Will continue to follow your bp and may need to increase bp med dose.  For neuropathy continue lyrica.   For hiv continue to follow up with ID.  For smoking recommend that you eventually stop.  Also recommend cutting back on alcohol use.   Placed referral for screening psa today. Also referred to GI MD for colonoscopy.        , PA-C

## 2021-06-24 NOTE — Patient Instructions (Addendum)
For you wellness exam today I have ordered cbc, cmp, lipid panel and psa.(Labs are future/schedule next week as want you to have them done fasting)  Discussed shingles vaccine. You decided would check with insurance to see if covered before we give.  Recommend exercise and healthy diet.  We will let you know lab results as they come in.  Follow up date appointment will be determined after lab review.    Your bp is borderline today but did come down and better when you are at ID office. Will continue to follow your bp and may need to increase bp med dose.  For neuropathy continue lyrica.   For hiv continue to follow up with ID.  For smoking recommend that you eventually stop.  Also recommend cutting back on alcohol use.   Placed referral for screening psa today. Also referred to GI MD for colonoscopy.  Preventive Care 24-54 Years Old, Male Preventive care refers to lifestyle choices and visits with your health care provider that can promote health and wellness. Preventive care visits are also called wellness exams. What can I expect for my preventive care visit? Counseling During your preventive care visit, your health care provider may ask about your: Medical history, including: Past medical problems. Family medical history. Current health, including: Emotional well-being. Home life and relationship well-being. Sexual activity. Lifestyle, including: Alcohol, nicotine or tobacco, and drug use. Access to firearms. Diet, exercise, and sleep habits. Safety issues such as seatbelt and bike helmet use. Sunscreen use. Work and work Astron[employer]. Physical exam Your health care provider will check your: Height and weight. These may be used to calculate your BMI (body mass index). BMI is a measurement that tells if you are at a healthy weight. Waist circumference. This measures the distance around your waistline. This measurement also tells if you are at a healthy weight and may help  predict your risk of certain diseases, such as type 2 diabetes and high blood pressure. Heart rate and blood pressure. Body temperature. Skin for abnormal spots. What immunizations do I need? Vaccines are usually given at various ages, according to a schedule. Your health care provider will recommend vaccines for you based on your age, medical history, and lifestyle or other factors, such as travel or where you work. What tests do I need? Screening Your health care provider may recommend screening tests for certain conditions. This may include: Lipid and cholesterol levels. Diabetes screening. This is done by checking your blood sugar (glucose) after you have not eaten for a while (fasting). Hepatitis B test. Hepatitis C test. HIV (human immunodeficiency virus) test. STI (sexually transmitted infection) testing, if you are at risk. Lung cancer screening. Prostate cancer screening. Colorectal cancer screening. Talk with your health care provider about your test results, treatment options, and if necessary, the need for more tests. Follow these instructions at home: Eating and drinking  Eat a diet that includes fresh fruits and vegetables, whole grains, lean protein, and low-fat dairy products. Take vitamin and mineral supplements as recommended by your health care provider. Do not drink alcohol if your health care provider tells you not to drink. If you drink alcohol: Limit how much you have to 0-2 drinks a day. Know how much alcohol is in your drink. In the [place] one drink equals one 12 oz bottle of beer (355 mL), one 5 oz glass of wine (148 mL), or one 1 oz glass of hard liquor (44 mL). Lifestyle Brush your teeth every morning and night with  fluoride toothpaste. Floss one time each day. Exercise for at least 30 minutes 5 or more days each week. Do not use any products that contain nicotine or tobacco. These products include cigarettes, chewing tobacco, and vaping devices, such as  e-cigarettes. If you need help quitting, ask your health care provider. Do not use drugs. If you are sexually active, practice safe sex. Use a condom or other form of protection to prevent STIs. Take aspirin only as told by your health care provider. Make sure that you understand how much to take and what form to take. Work with your health care provider to find out whether it is safe and beneficial for you to take aspirin daily. Find healthy ways to manage stress, such as: Meditation, yoga, or listening to music. Journaling. Talking to a trusted person. Spending time with friends and family. Minimize exposure to UV radiation to reduce your risk of skin cancer. Safety Always wear your seat belt while driving or riding in a vehicle. Do not drive: If you have been drinking alcohol. Do not ride with someone who has been drinking. When you are tired or distracted. While texting. If you have been using any mind-altering substances or drugs. Wear a helmet and other protective equipment during sports activities. If you have firearms in your house, make sure you follow all gun safety procedures. What's next? Go to your health care provider once a year for an annual wellness visit. Ask your health care provider how often you should have your eyes and teeth checked. Stay up to date on all vaccines. This information is not intended to replace advice given to you by your health care provider. Make sure you discuss any questions you have with your health care provider. Document Revised: 11/05/2020 Document Reviewed: 11/05/2020 Elsevier Patient Education  2022 [employer].

## 2021-06-30 ENCOUNTER — Other Ambulatory Visit: Payer: 59

## 2021-07-07 ENCOUNTER — Other Ambulatory Visit (INDEPENDENT_AMBULATORY_CARE_PROVIDER_SITE_OTHER): Payer: 59

## 2021-07-07 DIAGNOSIS — Z125 Encounter for screening for malignant neoplasm of prostate: Secondary | ICD-10-CM | POA: Diagnosis not present

## 2021-07-07 DIAGNOSIS — Z0000 Encounter for general adult medical examination without abnormal findings: Secondary | ICD-10-CM | POA: Diagnosis not present

## 2021-07-07 LAB — CBC WITH DIFFERENTIAL/PLATELET
Basophils Absolute: 0 10*3/uL (ref 0.0–0.1)
Basophils Relative: 0.6 % (ref 0.0–3.0)
Eosinophils Absolute: 0.1 10*3/uL (ref 0.0–0.7)
Eosinophils Relative: 2.1 % (ref 0.0–5.0)
HCT: 38.9 % — ABNORMAL LOW (ref 39.0–52.0)
Hemoglobin: 13 g/dL (ref 13.0–17.0)
Lymphocytes Relative: 56.6 % — ABNORMAL HIGH (ref 12.0–46.0)
Lymphs Abs: 2.1 10*3/uL (ref 0.7–4.0)
MCHC: 33.4 g/dL (ref 30.0–36.0)
MCV: 111.1 fl — ABNORMAL HIGH (ref 78.0–100.0)
Monocytes Absolute: 0.2 10*3/uL (ref 0.1–1.0)
Monocytes Relative: 6.7 % (ref 3.0–12.0)
Neutro Abs: 1.2 10*3/uL — ABNORMAL LOW (ref 1.4–7.7)
Neutrophils Relative %: 34 % — ABNORMAL LOW (ref 43.0–77.0)
Platelets: 243 10*3/uL (ref 150.0–400.0)
RBC: 3.5 Mil/uL — ABNORMAL LOW (ref 4.22–5.81)
RDW: 13.8 % (ref 11.5–15.5)
WBC: 3.6 10*3/uL — ABNORMAL LOW (ref 4.0–10.5)

## 2021-07-07 LAB — PSA: PSA: 3.24 ng/mL (ref 0.10–4.00)

## 2021-07-08 LAB — LIPID PANEL
Cholesterol: 214 mg/dL — ABNORMAL HIGH (ref 0–200)
HDL: 72.1 mg/dL (ref >39.00)
NonHDL: 141.55
Total CHOL/HDL Ratio: 3
Triglycerides: 351 mg/dL — ABNORMAL HIGH (ref 0.0–149.0)
VLDL: 70.2 mg/dL — ABNORMAL HIGH (ref 0.0–40.0)

## 2021-07-08 LAB — COMPREHENSIVE METABOLIC PANEL
ALT: 18 U/L (ref 0–53)
AST: 36 U/L (ref 0–37)
Albumin: 4.1 g/dL (ref 3.5–5.2)
Alkaline Phosphatase: 39 U/L (ref 39–117)
BUN: 13 mg/dL (ref 6–23)
CO2: 25 mEq/L (ref 19–32)
Calcium: 8.9 mg/dL (ref 8.4–10.5)
Chloride: 104 mEq/L (ref 96–112)
Creatinine, Ser: 1.26 mg/dL (ref 0.40–1.50)
GFR: 62.49 mL/min (ref >60.00)
Glucose, Bld: 92 mg/dL (ref 70–99)
Potassium: 4 mEq/L (ref 3.5–5.1)
Sodium: 138 mEq/L (ref 135–145)
Total Bilirubin: 0.7 mg/dL (ref 0.2–1.2)
Total Protein: 7.4 g/dL (ref 6.0–8.3)

## 2021-07-08 LAB — LDL CHOLESTEROL, DIRECT: Direct LDL: 75 mg/dL

## 2021-07-10 MED ORDER — EZETIMIBE 10 MG PO TABS: 10.0000 mg | ORAL_TABLET | Freq: Every day | ORAL | 3 refills | Status: DC

## 2021-07-10 NOTE — Addendum Note (Signed)
Addended by: Gwenevere Abbot on: 07/10/2021 01:49 PM   Modules accepted: Orders

## 2021-07-13 ENCOUNTER — Encounter: Payer: Self-pay | Admitting: Gastroenterology

## 2021-07-13 ENCOUNTER — Ambulatory Visit: Payer: 59 | Admitting: Medical

## 2021-07-16 ENCOUNTER — Telehealth: Payer: Self-pay | Admitting: Medical

## 2021-07-16 NOTE — Telephone Encounter (Signed)
Pt would like to go over his cholesterol results. Please advise.

## 2021-07-17 NOTE — Telephone Encounter (Signed)
Pt called and lvm to return call 

## 2021-08-01 ENCOUNTER — Encounter (HOSPITAL_COMMUNITY): Payer: Self-pay

## 2021-08-01 ENCOUNTER — Other Ambulatory Visit: Payer: Self-pay

## 2021-08-01 ENCOUNTER — Emergency Department (HOSPITAL_COMMUNITY): Payer: 59

## 2021-08-01 ENCOUNTER — Emergency Department (HOSPITAL_COMMUNITY)
Admission: EM | Admit: 2021-08-01 | Discharge: 2021-08-01 | Disposition: A | Discharge status: Home / Self Care | Payer: 59 | Attending: Emergency Medicine | Admitting: Emergency Medicine

## 2021-08-01 DIAGNOSIS — M5126 Other intervertebral disc displacement, lumbar region: Secondary | ICD-10-CM | POA: Insufficient documentation

## 2021-08-01 DIAGNOSIS — Z21 Asymptomatic human immunodeficiency virus [HIV] infection status: Secondary | ICD-10-CM | POA: Insufficient documentation

## 2021-08-01 LAB — CBC WITH DIFFERENTIAL/PLATELET
Abs Immature Granulocytes: 0.01 10*3/uL (ref 0.00–0.07)
Basophils Absolute: 0 10*3/uL (ref 0.0–0.1)
Basophils Relative: 1 %
Eosinophils Absolute: 0 10*3/uL (ref 0.0–0.5)
Eosinophils Relative: 0 %
HCT: 37.6 % — ABNORMAL LOW (ref 39.0–52.0)
Hemoglobin: 13 g/dL (ref 13.0–17.0)
Immature Granulocytes: 0 %
Lymphocytes Relative: 22 %
Lymphs Abs: 1.1 10*3/uL (ref 0.7–4.0)
MCH: 37.8 pg — ABNORMAL HIGH (ref 26.0–34.0)
MCHC: 34.6 g/dL (ref 30.0–36.0)
MCV: 109.3 fL — ABNORMAL HIGH (ref 80.0–100.0)
Monocytes Absolute: 0.4 10*3/uL (ref 0.1–1.0)
Monocytes Relative: 8 %
Neutro Abs: 3.4 10*3/uL (ref 1.7–7.7)
Neutrophils Relative %: 69 %
Platelets: 213 10*3/uL (ref 150–400)
RBC: 3.44 MIL/uL — ABNORMAL LOW (ref 4.22–5.81)
RDW: 12.5 % (ref 11.5–15.5)
WBC: 4.9 10*3/uL (ref 4.0–10.5)
nRBC: 0 % (ref 0.0–0.2)

## 2021-08-01 LAB — URINALYSIS, ROUTINE W REFLEX MICROSCOPIC
Bilirubin Urine: NEGATIVE
Glucose, UA: NEGATIVE mg/dL
Hgb urine dipstick: NEGATIVE
Ketones, ur: 20 mg/dL — AB
Leukocytes,Ua: NEGATIVE
Nitrite: NEGATIVE
Protein, ur: NEGATIVE mg/dL
Specific Gravity, Urine: 1.021 (ref 1.005–1.030)
pH: 5 (ref 5.0–8.0)

## 2021-08-01 LAB — BASIC METABOLIC PANEL
Anion gap: 12 (ref 5–15)
BUN: 16 mg/dL (ref 6–20)
CO2: 22 mmol/L (ref 22–32)
Calcium: 9 mg/dL (ref 8.9–10.3)
Chloride: 100 mmol/L (ref 98–111)
Creatinine, Ser: 1.29 mg/dL — ABNORMAL HIGH (ref 0.61–1.24)
GFR, Estimated: >60 mL/min (ref >60)
Glucose, Bld: 106 mg/dL — ABNORMAL HIGH (ref 70–99)
Potassium: 3.8 mmol/L (ref 3.5–5.1)
Sodium: 134 mmol/L — ABNORMAL LOW (ref 135–145)

## 2021-08-01 MED ORDER — OXYCODONE-ACETAMINOPHEN 5-325 MG PO TABS
1.0000 | ORAL_TABLET | Freq: Four times a day (QID) | ORAL | 0 refills | Status: DC | PRN
Start: 2021-08-01 — End: 2021-08-02

## 2021-08-01 MED ORDER — OXYCODONE-ACETAMINOPHEN 5-325 MG PO TABS
1.0000 | ORAL_TABLET | Freq: Once | ORAL | Status: AC
  Administered 2021-08-01: 1 via ORAL
  Filled 2021-08-01: qty 1

## 2021-08-01 MED ORDER — OXYCODONE-ACETAMINOPHEN 5-325 MG PO TABS: 1.0000 | ORAL_TABLET | Freq: Four times a day (QID) | ORAL | 0 refills | Status: DC | PRN

## 2021-08-01 NOTE — ED Notes (Signed)
Pt ambulated in room w/o assist. Supervision for safety. Pt gait's slow and steady. Pt reports 7/10 pain in lower back and it radiates down R leg.  ?

## 2021-08-01 NOTE — ED Provider Notes (Signed)
[name] Provider Note   CSN: [id number] Arrival date & time: 08/01/21  [id number]     History  No chief complaint on file.   Lucas Watts is a 61 y.o. male with history of HIV and back surgery of the L4-L5 in 1987 presents to the ED for evaluation of sudden onset lower back pain.  Patient states that he was bending over, lifting heavy things off the floor when he suddenly felt like his back gave out.  He fell to the ground and was unable to stand and needed to be lifted off the floor.  He is endorsing severe numbness and tingling radiating down the right leg as well as some groin numbness and tingling.  He denies urinary or bowel dysfunction.  No treatment prior to arrival.  He denies fevers, chest pain, shortness of breath, abdominal pain, nausea, vomiting and diarrhea.  HPI     Home Medications Prior to Admission medications   Medication Sig Start Date End Date Taking? Authorizing Provider  elvitegravir-cobicistat-emtricitabine-tenofovir (GENVOYA) 150-150-200-10 MG TABS tablet Take 1 tablet by mouth daily with breakfast. 01/07/21   Michel Bickers, MD  ezetimibe (ZETIA) 10 MG tablet Take 1 tablet (10 mg total) by mouth daily. 07/10/21   Saguier, Percell Miller, PA-C  lisinopril (ZESTRIL) 5 MG tablet Take 1 tablet (5 mg total) by mouth daily. 04/21/21   Michel Bickers, MD  pregabalin (LYRICA) 100 MG capsule Take 1 capsule (100 mg total) by mouth at bedtime. 10/16/20   Michel Bickers, MD      Allergies    Patient has no known allergies.    Review of Systems   Review of Systems  Physical Exam Updated Vital Signs BP (!) 147/97    Pulse 74    Resp 18    SpO2 95%  Physical Exam Vitals and nursing note reviewed.  Constitutional:      General: He is not in acute distress.    Appearance: He is not ill-appearing.     Comments: Acutely uncomfortable, in severe pain  HENT:     Head: Atraumatic.  Eyes:     Conjunctiva/sclera: Conjunctivae normal.   Cardiovascular:     Rate and Rhythm: Normal rate and regular rhythm.     Pulses: Normal pulses.     Heart sounds: No murmur heard. Pulmonary:     Effort: Pulmonary effort is normal. No respiratory distress.     Breath sounds: Normal breath sounds.  Abdominal:     General: Abdomen is flat. There is no distension.     Palpations: Abdomen is soft.     Tenderness: There is no abdominal tenderness.  Musculoskeletal:        General: Normal range of motion.     Cervical back: Normal range of motion.     Comments: Severe focal tenderness at the L4/L5 vertebrae that produces reproducible radicular pain down the right leg.  T-spine nontender to palpation at midline or paraspinal muscles  Skin:    General: Skin is warm and dry.     Capillary Refill: Capillary refill takes less than 2 seconds.  Neurological:     General: No focal deficit present.     Mental Status: He is alert.     Comments: Sensation intact and equal bilaterally of the lower extremities.  Psychiatric:        Mood and Affect: Mood normal.    ED Results / Procedures / Treatments   Labs (all labs ordered are listed, but only abnormal results  are displayed) Labs Reviewed  BASIC METABOLIC PANEL - Abnormal; Notable for the following components:      Result Value   Sodium 134 (*)    Glucose, Bld 106 (*)    Creatinine, Ser 1.29 (*)    All other components within normal limits  CBC WITH DIFFERENTIAL/PLATELET - Abnormal; Notable for the following components:   RBC 3.44 (*)    HCT 37.6 (*)    MCV 109.3 (*)    MCH 37.8 (*)    All other components within normal limits  URINALYSIS, ROUTINE W REFLEX MICROSCOPIC    EKG None  Radiology MR LUMBAR SPINE WO CONTRAST  Result Date: 08/01/2021 CLINICAL DATA:  Lumbar radiculopathy, trauma focal lumbar tenderness, groin numbness EXAM: MRI LUMBAR SPINE WITHOUT CONTRAST TECHNIQUE: Multiplanar, multisequence MR imaging of the lumbar spine was performed. No intravenous contrast was  administered. COMPARISON:  None. FINDINGS: Segmentation:  Standard. Alignment: 3 mm grade 1 anterolisthesis L3 on L4. Trace retrolisthesis L5 on S1. Straightening of the lumbar lordosis. Vertebrae: No fracture, evidence of discitis, or bone lesion. Mild discogenic endplate marrow edema at the L3-4 and L5-S1 levels. Chronic endplate marrow changes at L4-5. Conus medullaris and cauda equina: Conus extends to the L1 level. Conus and cauda equina appear normal. Paraspinal and other soft tissues: Cortically based T2 hyperintense lesionswithin the bilateral kidneys, incompletely characterized, but most likely represent cysts. Disc levels: T12-L1: No significant disc protrusion, foraminal stenosis, or canal stenosis. L1-L2: No significant disc protrusion, foraminal stenosis, or canal stenosis. L2-L3: No significant disc protrusion, foraminal stenosis, or canal stenosis. L3-L4: Anterolisthesis with mild diffuse disc bulge. Minimal bilateral facet hypertrophy. No canal stenosis. Mild bilateral foraminal stenosis, left slightly worse than right. L4-L5: Postsurgical changes to the right lamina. Mild diffuse disc bulge, slightly eccentric to the left. Mild bilateral facet arthropathy. Mild left foraminal stenosis. No canal stenosis. L5-S1: Mild diffuse disc bulge with focal right subarticular disc protrusion. There is resultant mass effect upon the descending right S1 nerve root within the right subarticular recess. No significant facet arthropathy. Mild bilateral foraminal stenosis. No canal stenosis. IMPRESSION: 1. Right subarticular disc protrusion at L5-S1 with resultant mass effect upon the descending right S1 nerve root within the right subarticular recess. Correlate for radicular symptoms in this distribution. 2. Mild bilateral foraminal stenosis at L3-4 and L5-S1. 3. No canal stenosis at any level. Electronically Signed   By: Davina Poke D.O.   On: 08/01/2021 13:28    Procedures Procedures    Medications  Ordered in ED Medications  oxyCODONE-acetaminophen (PERCOCET/ROXICET) 5-325 MG per tablet 1 tablet (1 tablet Oral Given 08/01/21 1057)    ED Course/ Medical Decision Making/ A&P                           Medical Decision Making Amount and/or Complexity of Data Reviewed Labs: ordered. Radiology: ordered.   History:  Per HPI Social determinants of health: None  Initial impression:  This patient presents to the ED for concern of low back pain, this involves an extensive number of treatment options, and is a complaint that carries with it a high risk of complications and morbidity.   Emergent considerations in the differential diagnosis of back pain include:occult fracture, congenital anomalies, tumors, vascular catastrophes, osteomyelitis of vertebrae, infections of disc, meninges or cord, space occupying lesions within canal leading to cord or root compression including epidural abscess. Patient is 61 years old, acutely uncomfortable lying on his right side.  Sensation of the distal lower extremities intact, however he does have focal tenderness at the L4-L5 vertebrae with reproducible radicular symptoms down the right leg.  Given his history of back surgery and midline tenderness, will obtain MRI of the lumbar spine and basic labs.  Percocet provided for pain management.   Lab Tests and EKG:  I Ordered, reviewed, and interpreted labs and EKG.  The pertinent results include:  BMP unremarkable, mildly elevated creatinine at patient's baseline CBC unremarkable UA pending   Imaging Studies ordered:  I ordered imaging studies including  MRI lumbar spine - Disc protrusion at the L5 - S1 with mass effect on the S1 nerve root  I independently visualized and interpreted imaging and I agree with the radiologist interpretation.    Medicines ordered and prescription drug management:  I ordered medication including: Percocet for pain Reevaluation of the patient after these medicines  showed that the patient improved I have reviewed the patients home medicines and have made adjustments as needed   ED Course: Patient is a presented with sudden onset lower back pain while bending over and lifting things at work.  He had reproducible radicular symptoms on palpation of the lower lumbar although sensation was intact bilaterally.  MRI shows disc protrusion at the L L5-S1.  After the Percocet, patient was more amenable to full neuro exam as his pain was controlled.  Strength on dorsiflexion and plantarflexion intact bilaterally.  Full active range of motion of the hip/trunk.  I had the NT stand him up and walk him and he was ambulatory.  He did produce a urine sample on request which again reduces concern for cauda equina.  Will provide neurosurgeon follow-up and pain medications.  Disposition:  After consideration of the diagnostic results, physical exam, history and the patients response to treatment feel that the patent would benefit from discharge with strict return precautions.   Lumbar herniated disc: We discussed in depth return precautions and what warrants immediate return back to the ED.  Discussed at home supportive measures including light physical activity without complete bedrest.  Alternating Tylenol and Motrin.  All questions were asked and answered.  Neurosurgeon follow-up provided.  Patient was discharged home in good condition.    Final Clinical Impression(s) / ED Diagnoses Final diagnoses:  Lumbar herniated disc    Rx / DC Orders ED Discharge Orders     None         Tonye Pearson, PA-C 08/01/21 1358    Lorelle Gibbs, DO 08/01/21 1542

## 2021-08-01 NOTE — Discharge Instructions (Addendum)
Your MRI shows that you have herniated disc in your lower back which is causing your symptoms.  I will send you home with a few tablets of pain medication.  Please alternate using Tylenol and Motrin as well in order to keep your pain at bay.  Recommend light activity without complete bedrest as this can aggravate symptoms. ? ?You can take motrin 800mg  every 6 hours and tylenol 650mg /1000mg  every 8 hours to keep symptoms at bay.  ? ?Please follow up with neurosurgeon above for further evaluation.  ? ?If you develop numbness of the groin, find that you have urinated or defecated on yourself or are trying to pee/poop without being able to, please return immediately to the ED.  ?

## 2021-08-01 NOTE — ED Triage Notes (Signed)
Patient presented to the ed with c/o back pain started 2 hours ago while at work. Patient report pain is radiating to right leg and it fell like needles. Patient have hx of back surgery. ?

## 2021-08-02 MED ORDER — HYDROCODONE-ACETAMINOPHEN 5-325 MG PO TABS: 1.0000 | ORAL_TABLET | Freq: Three times a day (TID) | ORAL | 0 refills | Status: DC | PRN

## 2021-08-02 MED ORDER — HYDROCODONE-ACETAMINOPHEN 5-325 MG PO TABS
1.0000 | ORAL_TABLET | Freq: Three times a day (TID) | ORAL | 0 refills | Status: AC | PRN
Start: 2021-08-02 — End: 2021-08-05

## 2021-08-02 NOTE — ED Notes (Addendum)
Pt called back stating the pharmacy never received the prescription from yesterday. Called the [facility], they have no record of prescription despite being sent yesterday, appears their system was down. MD Nanavati aware, attempting to re-send prescription. 11:20 am, 08/02/20. ?

## 2021-08-02 NOTE — Telephone Encounter (Signed)
Confirmed with pharmacy.  It appears that his prescription never got to the pharmacy.  They had some issues yesterday with receiving orders. ? ?I will be sending in the narcotic prescription. ?

## 2021-08-26 ENCOUNTER — Encounter: Payer: 59 | Admitting: Gastroenterology

## 2021-08-28 ENCOUNTER — Telehealth: Payer: 59 | Admitting: Nurse Practitioner

## 2021-08-28 DIAGNOSIS — R066 Hiccough: Secondary | ICD-10-CM

## 2021-08-29 NOTE — Progress Notes (Signed)
Based on what you shared with me it looks like you have prolonged hiccups,that should be evaluated in a face to face office visit. I am not able to give you anything to stop them in an evisit. ? ?NOTE: There will be NO CHARGE for this eVisit ?  ?If you are having a true medical emergency please call 911.   ?  ? For an urgent face to face visit, [facility] has six urgent care centers for your convenience:  ?  ? [facility] ?Get Driving Directions ?[phone] ?[phone] ?[place], [place] [address] ?  ? [facility]) ?Get Driving Directions ?[phone] ?[address] ?[place], [place] [address] ? ?[facility]) ?Get Driving Directions ?[phone] ?[address] ?[place],  [place]  [address] ? ?[facility] ?Get Driving Directions ?[phone] ?[address] ?[place], [place] [address] ?  ?[facility] [place] ?Get Driving Directions  ?[phone] ?[address] ?[address] ?[place], [place] [address] ?  ?[facility] ?Get Driving Directions ?[phone] ?[address] ?[place], [place] [address] ? ?Your MyChart E-visit questionnaire answers were reviewed by a board certified advanced clinical practitioner to complete your personal care plan based on your specific symptoms.  Thank you for using e-Visits. ?  ? ?

## 2021-09-07 ENCOUNTER — Telehealth: Payer: Self-pay | Admitting: *Deleted

## 2021-09-07 NOTE — Telephone Encounter (Signed)
No return call received procedure cancelled and no show letter sent. °

## 2021-09-07 NOTE — Telephone Encounter (Signed)
Attempted to call x 2 message left with call back number,to return call by 5 pm to reschedule pre-visit or upcoming procedure on 09/23/21 will be cancelled. ?

## 2021-09-23 ENCOUNTER — Encounter: Payer: 59 | Admitting: Gastroenterology

## 2021-11-02 ENCOUNTER — Encounter: Payer: Self-pay | Admitting: Infectious Diseases

## 2021-11-11 ENCOUNTER — Other Ambulatory Visit: Payer: Self-pay | Admitting: Internal Medicine

## 2021-11-11 DIAGNOSIS — I10 Essential (primary) hypertension: Secondary | ICD-10-CM

## 2021-11-11 NOTE — Telephone Encounter (Signed)
Rx lisinopril refill sent to pt pharmacy.

## 2021-12-10 ENCOUNTER — Other Ambulatory Visit: Payer: Self-pay | Admitting: Medical

## 2021-12-23 ENCOUNTER — Other Ambulatory Visit: Payer: 59

## 2021-12-23 ENCOUNTER — Other Ambulatory Visit: Payer: Self-pay

## 2021-12-23 DIAGNOSIS — B20 Human immunodeficiency virus [HIV] disease: Secondary | ICD-10-CM

## 2021-12-24 LAB — T-HELPER CELL (CD4) - (RCID CLINIC ONLY)
CD4 % Helper T Cell: 36 % (ref 33–65)
CD4 T Cell Abs: 533 /uL (ref 400–1790)

## 2021-12-28 LAB — COMPREHENSIVE METABOLIC PANEL
AG Ratio: 1.3 (calc) (ref 1.0–2.5)
ALT: 23 U/L (ref 9–46)
AST: 25 U/L (ref 10–35)
Albumin: 4 g/dL (ref 3.6–5.1)
Alkaline phosphatase (APISO): 36 U/L (ref 35–144)
BUN/Creatinine Ratio: 9 (calc) (ref 6–22)
BUN: 12 mg/dL (ref 7–25)
CO2: 23 mmol/L (ref 20–32)
Calcium: 9.2 mg/dL (ref 8.6–10.3)
Chloride: 104 mmol/L (ref 98–110)
Creat: 1.41 mg/dL — ABNORMAL HIGH (ref 0.70–1.30)
Globulin: 3.1 g/dL (calc) (ref 1.9–3.7)
Glucose, Bld: 117 mg/dL — ABNORMAL HIGH (ref 65–99)
Potassium: 3.8 mmol/L (ref 3.5–5.3)
Sodium: 136 mmol/L (ref 135–146)
Total Bilirubin: 0.8 mg/dL (ref 0.2–1.2)
Total Protein: 7.1 g/dL (ref 6.1–8.1)

## 2021-12-28 LAB — LIPID PANEL
Cholesterol: 209 mg/dL — ABNORMAL HIGH (ref <200)
HDL: 62 mg/dL (ref >=40)
LDL Cholesterol (Calc): 106 mg/dL (calc) — ABNORMAL HIGH
Non-HDL Cholesterol (Calc): 147 mg/dL (calc) — ABNORMAL HIGH (ref <130)
Total CHOL/HDL Ratio: 3.4 (calc) (ref <5.0)
Triglycerides: 305 mg/dL — ABNORMAL HIGH (ref <150)

## 2021-12-28 LAB — CBC
HCT: 37.8 % — ABNORMAL LOW (ref 38.5–50.0)
Hemoglobin: 13 g/dL — ABNORMAL LOW (ref 13.2–17.1)
MCH: 37.6 pg — ABNORMAL HIGH (ref 27.0–33.0)
MCHC: 34.4 g/dL (ref 32.0–36.0)
MCV: 109.2 fL — ABNORMAL HIGH (ref 80.0–100.0)
MPV: 9.4 fL (ref 7.5–12.5)
Platelets: 186 10*3/uL (ref 140–400)
RBC: 3.46 10*6/uL — ABNORMAL LOW (ref 4.20–5.80)
RDW: 13.5 % (ref 11.0–15.0)
WBC: 4 10*3/uL (ref 3.8–10.8)

## 2021-12-28 LAB — RPR: RPR Ser Ql: NONREACTIVE

## 2021-12-28 LAB — HIV-1 RNA QUANT-NO REFLEX-BLD
HIV 1 RNA Quant: NOT DETECTED Copies/mL
HIV-1 RNA Quant, Log: NOT DETECTED Log cps/mL

## 2022-01-06 ENCOUNTER — Ambulatory Visit: Payer: 59

## 2022-01-06 ENCOUNTER — Other Ambulatory Visit: Payer: Self-pay

## 2022-01-06 ENCOUNTER — Encounter: Payer: Self-pay | Admitting: Internal Medicine

## 2022-01-06 ENCOUNTER — Ambulatory Visit (INDEPENDENT_AMBULATORY_CARE_PROVIDER_SITE_OTHER): Payer: 59 | Admitting: Internal Medicine

## 2022-01-06 DIAGNOSIS — B20 Human immunodeficiency virus [HIV] disease: Secondary | ICD-10-CM

## 2022-01-06 DIAGNOSIS — N289 Disorder of kidney and ureter, unspecified: Secondary | ICD-10-CM | POA: Diagnosis not present

## 2022-01-06 DIAGNOSIS — F17210 Nicotine dependence, cigarettes, uncomplicated: Secondary | ICD-10-CM

## 2022-01-06 MED ORDER — GENVOYA 150-150-200-10 MG PO TABS: 1.0000 | ORAL_TABLET | Freq: Every day | ORAL | 11 refills | Status: DC

## 2022-01-06 NOTE — Assessment & Plan Note (Signed)
His infection remains under excellent, long-term control.  He will continue Genvoya and follow-up after lab work in 1 year. 

## 2022-01-06 NOTE — Assessment & Plan Note (Signed)
I do not know all of his past antiretroviral regimens.  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  No change in antiretroviral therapy is indicated.

## 2022-01-06 NOTE — Assessment & Plan Note (Signed)
I encouraged him to consider setting a quit date and trying to quit completely.

## 2022-01-06 NOTE — Progress Notes (Signed)
Patient Active Problem List   Diagnosis Date Noted   HIV disease (HCC) 03/15/2016    Priority: High   Peripheral neuropathy 10/16/2020   Dyslipidemia 09/06/2016   Renal insufficiency 09/06/2016   Essential hypertension 03/16/2016   Cigarette smoker 03/16/2016   History of depression 03/16/2016    Patient's Medications  New Prescriptions   No medications on file  Previous Medications   EZETIMIBE (ZETIA) 10 MG TABLET    TAKE 1 TABLET BY MOUTH EVERY DAY   LISINOPRIL (ZESTRIL) 5 MG TABLET    TAKE 1 TABLET(5 MG) BY MOUTH DAILY   PREGABALIN (LYRICA) 100 MG CAPSULE    Take 1 capsule (100 mg total) by mouth at bedtime.  Modified Medications   Modified Medication Previous Medication   ELVITEGRAVIR-COBICISTAT-EMTRICITABINE-TENOFOVIR (GENVOYA) 150-150-200-10 MG TABS TABLET elvitegravir-cobicistat-emtricitabine-tenofovir (GENVOYA) 150-150-200-10 MG TABS tablet      Take 1 tablet by mouth daily with breakfast.    Take 1 tablet by mouth daily with breakfast.  Discontinued Medications   No medications on file    Subjective: Lucas Watts is in for his routine HIV follow-up visit.  He denies any problems obtaining, taking or tolerating his Genvoya.  He takes it each morning with breakfast and does not recall missing doses.  He recently hurt his back at work.  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  He had a lumbar surgery years ago while living in [place].  He recently saw Dr. Barbaraann Barthel and tells me that lumbar surgery was considered again but he says that he got some improvement in his pain with physical therapy.  He plans to avoid surgery if at all possible and is thinking about finding a new job.  He continues to smoke a few cigarettes daily.  He currently has no plan to quit completely.  Review of Systems: Review of Systems  Constitutional:  Negative for fever and weight loss.  Respiratory:  Negative for cough and shortness of breath.    Cardiovascular:  Negative for chest pain.  Musculoskeletal:  Positive for back pain.  Psychiatric/Behavioral:  Negative for depression.     Past Medical History:  Diagnosis Date   HIV infection (HCC)     Social History   Tobacco Use   Smoking status: Every Day    Packs/day: 0.25    Years: 40.00    Total pack years: 10.00    Types: Cigarettes   Smokeless tobacco: Never   Tobacco comments:    never was a big smoker  Vaping Use   Vaping Use: Never used  Substance Use Topics   Alcohol use: Not Currently    Alcohol/week: 28.0 standard drinks of alcohol    Types: 28 Cans of beer per week    Comment: 4 beers a day.   Drug use: No    No family history on file.  No Known Allergies  Health Maintenance  Topic Date Due   Hepatitis C Screening  Never done   Zoster Vaccines- Shingrix (1 of 2) Never done   COLONOSCOPY (Pts 45-41yrs Insurance coverage will need to be confirmed)  Never done   COVID-19 Vaccine (7 - Pfizer risk series) 07/24/2020   INFLUENZA VACCINE  12/22/2021   TETANUS/TDAP  09/19/2027   HIV Screening  Completed   HPV VACCINES  Aged Out    Objective:  Vitals:   01/06/22 0944  BP: (!) 141/103  Pulse: 99  Resp: 16  Temp: 98.3 F (36.8 C)  TempSrc: Oral  SpO2: 97%  Weight: 150 lb (68 kg)  Height: 5\' 8"  (1.727 m)   Body mass index is 22.81 kg/m.  Physical Exam Constitutional:      Comments: His spirits are good.  Cardiovascular:     Rate and Rhythm: Normal rate and regular rhythm.     Heart sounds: No murmur heard. Pulmonary:     Effort: Pulmonary effort is normal.     Breath sounds: Normal breath sounds.  Neurological:     General: No focal deficit present.     Gait: Gait normal.  Psychiatric:        Mood and Affect: Mood normal.     Lab Results Lab Results  Component Value Date   WBC 4.0 12/23/2021   HGB 13.0 (L) 12/23/2021   HCT 37.8 (L) 12/23/2021   MCV 109.2 (H) 12/23/2021   PLT 186 12/23/2021    Lab Results  Component  Value Date   CREATININE 1.41 (H) 12/23/2021   BUN 12 12/23/2021   NA 136 12/23/2021   K 3.8 12/23/2021   CL 104 12/23/2021   CO2 23 12/23/2021    Lab Results  Component Value Date   ALT 23 12/23/2021   AST 25 12/23/2021   ALKPHOS 39 07/07/2021   BILITOT 0.8 12/23/2021    Lab Results  Component Value Date   CHOL 209 (H) 12/23/2021   HDL 62 12/23/2021   LDLCALC 106 (H) 12/23/2021   LDLDIRECT 75.0 07/07/2021   TRIG 305 (H) 12/23/2021   CHOLHDL 3.4 12/23/2021   Lab Results  Component Value Date   LABRPR NON-REACTIVE 12/23/2021   HIV 1 RNA Quant (Copies/mL)  Date Value  12/23/2021 Not Detected  12/22/2020 Not Detected  10/16/2020 Not Detected   CD4 T Cell Abs (/uL)  Date Value  12/23/2021 533  12/22/2020 696  10/16/2020 549     Problem List Items Addressed This Visit       High   HIV disease (HCC)    His infection remains under excellent, long-term control.  He will continue Genvoya and follow-up after lab work in 1 year.      Relevant Medications   elvitegravir-cobicistat-emtricitabine-tenofovir (GENVOYA) 150-150-200-10 MG TABS tablet   Other Relevant Orders   CBC   T-helper cells (CD4) count (not at [facility])   Comprehensive metabolic panel   Lipid panel   RPR   HIV-1 RNA quant-no reflex-bld     Unprioritized   Cigarette smoker    I encouraged him to consider setting a quit date and trying to quit completely.      Renal insufficiency    I do not know all of his past antiretroviral regimens.  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  No change in antiretroviral therapy is indicated.         Darrin Luis, MD [place] for Infectious Disease [facility] [phone] pager   [phone] cell 01/06/2022, 10:05 AM

## 2022-01-20 ENCOUNTER — Telehealth: Payer: Self-pay

## 2022-01-20 ENCOUNTER — Other Ambulatory Visit (HOSPITAL_COMMUNITY): Payer: Self-pay

## 2022-01-20 NOTE — Telephone Encounter (Signed)
[facility] Patient Advocate Encounter   Was successful in obtaining a [place] copay card for Lucas Watts.  This copay card will make the patients copay $0.00.  I have spoken with the patient.        Lucas Watts, CPhT Specialty Pharmacy Patient [facility] Phone: [phone] Fax:  [phone]

## 2022-01-26 ENCOUNTER — Encounter: Payer: Self-pay | Admitting: Internal Medicine

## 2022-02-17 ENCOUNTER — Telehealth: Payer: Self-pay

## 2022-02-17 NOTE — Telephone Encounter (Signed)
Patient called back and said pharmacy did not have records of his prescriptions for ezetimibe or lisinopril.  Spoke with [facility] pharmacist, who stated prescriptions were transferred to [facility] due to patient's coverage with [employer]. [facility].  Called patient to advise (per pharmacist) he needs to [facility] [phone]) and ask their pharmacist to call [place] [phone]) to transfer the prescriptions.   No answer. Left HIPAA-compliant voicemail requesting call back. Will send MyChart message.  Binnie Kand, RN

## 2022-02-17 NOTE — Telephone Encounter (Signed)
Received call from patient requesting refills for ezetimibe 10 mg and lisinopril 5 mg. Per chart review, medications were prescribed by patient's PCP (Dr. Harvie Heck), and refills are available. Recommended to patient that he call his pharmacy to follow up. Provided [employer] phone number. All questions answered.  Binnie Kand, RN

## 2022-03-16 ENCOUNTER — Other Ambulatory Visit: Payer: Self-pay

## 2022-03-16 ENCOUNTER — Telehealth: Payer: Self-pay | Admitting: Medical

## 2022-03-16 DIAGNOSIS — B20 Human immunodeficiency virus [HIV] disease: Secondary | ICD-10-CM

## 2022-03-16 DIAGNOSIS — I10 Essential (primary) hypertension: Secondary | ICD-10-CM

## 2022-03-16 MED ORDER — GENVOYA 150-150-200-10 MG PO TABS: 1.0000 | ORAL_TABLET | Freq: Every day | ORAL | 11 refills | Status: DC

## 2022-03-16 MED ORDER — EZETIMIBE 10 MG PO TABS: 10.0000 mg | ORAL_TABLET | Freq: Every day | ORAL | 3 refills | Status: DC

## 2022-03-16 MED ORDER — LISINOPRIL 5 MG PO TABS: ORAL_TABLET | ORAL | 5 refills | Status: DC

## 2022-03-16 NOTE — Telephone Encounter (Signed)
Patient stated he has not been receiving refills. Advised patient there are refills still available at [facility] in [place] but patient uses mail order now. ** Due to patient not taken meds for a while, please [facility] [address], [place] and the rest through the mail order service below.   Medication: ezetimibe (ZETIA) 10 MG tablet [[id number]]   lisinopril (ZESTRIL) 5 MG tablet     Preferred Pharmacy (with phone number or street name):  [facility] [place] [place] [facility] [address], [place] [address] Phone: [phone]  Fax: [phone]   Agent: Please be advised that RX refills may take up to 3 business days. We ask that you follow-up with your pharmacy.

## 2022-03-16 NOTE — Telephone Encounter (Signed)
[facility]  

## 2022-03-18 ENCOUNTER — Other Ambulatory Visit: Payer: Self-pay | Admitting: Internal Medicine

## 2022-03-18 DIAGNOSIS — B20 Human immunodeficiency virus [HIV] disease: Secondary | ICD-10-CM

## 2022-06-01 ENCOUNTER — Ambulatory Visit: Payer: 59 | Admitting: Internal Medicine

## 2022-06-02 ENCOUNTER — Ambulatory Visit (INDEPENDENT_AMBULATORY_CARE_PROVIDER_SITE_OTHER): Payer: 59 | Admitting: Internal Medicine

## 2022-06-02 ENCOUNTER — Other Ambulatory Visit: Payer: Self-pay

## 2022-06-02 VITALS — BP 124/80 | HR 87 | Temp 98.1°F | Wt 140.0 lb

## 2022-06-02 DIAGNOSIS — B20 Human immunodeficiency virus [HIV] disease: Secondary | ICD-10-CM

## 2022-06-02 DIAGNOSIS — M5416 Radiculopathy, lumbar region: Secondary | ICD-10-CM | POA: Diagnosis not present

## 2022-06-02 MED ORDER — GENVOYA 150-150-200-10 MG PO TABS: 1.0000 | ORAL_TABLET | Freq: Every day | ORAL | 3 refills | Status: DC

## 2022-06-02 NOTE — Progress Notes (Signed)
Patient Active Problem List   Diagnosis Date Noted   HIV disease ([place]) 03/15/2016    Priority: High   Lumbar radiculopathy, right 06/02/2022   Peripheral neuropathy 10/16/2020   Dyslipidemia 09/06/2016   Renal insufficiency 09/06/2016   Essential hypertension 03/16/2016   Cigarette smoker 03/16/2016   History of depression 03/16/2016    Patient's Medications  New Prescriptions   No medications on file  Previous Medications   EZETIMIBE (ZETIA) 10 MG TABLET    Take 1 tablet (10 mg total) by mouth daily.   LISINOPRIL (ZESTRIL) 5 MG TABLET    TAKE 1 TABLET(5 MG) BY MOUTH DAILY   PREGABALIN (LYRICA) 100 MG CAPSULE    Take 1 capsule (100 mg total) by mouth at bedtime.  Modified Medications   Modified Medication Previous Medication   ELVITEGRAVIR-COBICISTAT-EMTRICITABINE-TENOFOVIR (GENVOYA) 150-150-200-10 MG TABS TABLET elvitegravir-cobicistat-emtricitabine-tenofovir (GENVOYA) 150-150-200-10 MG TABS tablet      Take 1 tablet by mouth daily.    Take 1 tablet by mouth daily.  Discontinued Medications   No medications on file    Subjective: Lucas Watts is in for a work in visit.  He is accompanied by his spouse, Lucas Watts.  At the time of his last visit here in August of last year he told me that he had injured his lower back while working on the job.  Apparently he has had been gradually worsening, severe low back pain radiating into his right leg ever since.  Initially he could not tell me anything about what has transpired since last August but slowly I was able to learn that he has seen a physician, Dr. Sofie Hartigan at [facility] in [facility].  It sounds like he was referred to a neurosurgeon, Dr. Dayton Bailiff, here in [place].  It sounds like all of that occurred sometime last fall.  He tells me that Dr. Christella Noa recommended surgery but he said he did not want an operation because of his past experience with back surgery.  He is not sure if he has a  follow-up visit with Dr. Christella Noa or any other providers.  I do not have access to any of those records.  I can see that he had a lumbar MRI on 08/01/2021.  It revealed:  IMPRESSION: 1. Right subarticular disc protrusion at L5-S1 with resultant mass effect upon the descending right S1 nerve root within the right subarticular recess. Correlate for radicular symptoms in this distribution. 2. Mild bilateral foraminal stenosis at L3-4 and L5-S1. 3. No canal stenosis at any level.  At some point he underwent physical therapy with minimal relief of his pain.  He says that he also had to injections that gave only temporary relief for about 1 to 2 weeks.  He has been taking Motrin and Excedrin as well as over-the-counter Nervive.  He says that his pain has not caused him any problems obtaining, taking or tolerating his Genvoya.  He does not recall missing doses.  Review of Systems: Review of Systems  Constitutional:  Positive for weight loss. Negative for fever.  Musculoskeletal:  Positive for back pain.    Past Medical History:  Diagnosis Date   HIV infection ([place])     Social History   Tobacco Use   Smoking status: Every Day    Packs/day: 0.25    Years: 40.00    Total pack years: 10.00    Types: Cigarettes   Smokeless tobacco: Never   Tobacco comments:  never was a big smoker  Vaping Use   Vaping Use: Never used  Substance Use Topics   Alcohol use: Not Currently    Alcohol/week: 28.0 standard drinks of alcohol    Types: 28 Cans of beer per week    Comment: 4 beers a day.   Drug use: No    No family history on file.  No Known Allergies  Health Maintenance  Topic Date Due   Hepatitis C Screening  Never done   Zoster Vaccines- Shingrix (1 of 2) Never done   COLONOSCOPY (Pts 45-45yrs Insurance coverage will need to be confirmed)  Never done   INFLUENZA VACCINE  12/22/2021   COVID-19 Vaccine (7 - 2023-24 season) 01/22/2022   DTaP/Tdap/Td (3 - Td or Tdap) 09/19/2027   HIV  Screening  Completed   HPV VACCINES  Aged Out    Objective:  Vitals:   06/02/22 0914  BP: 124/80  Pulse: 87  Temp: 98.1 F (36.7 C)  TempSrc: Oral  SpO2: 100%  Weight: 140 lb (63.5 kg)   Body mass index is 21.29 kg/m.  Physical Exam Constitutional:      Comments: He is lying on his back on the exam table.  He is very distracted by pain and has to be prompted to answer questions repeatedly.  He has lost about 20 pounds over the past year.  Musculoskeletal:     Comments: He is able to raise and lower his legs without obvious discomfort.  He is able to cross his right leg over his left.  Not appear to have any significant weakness in his legs.      Lab Results Lab Results  Component Value Date   WBC 4.0 12/23/2021   HGB 13.0 (L) 12/23/2021   HCT 37.8 (L) 12/23/2021   MCV 109.2 (H) 12/23/2021   PLT 186 12/23/2021    Lab Results  Component Value Date   CREATININE 1.41 (H) 12/23/2021   BUN 12 12/23/2021   NA 136 12/23/2021   K 3.8 12/23/2021   CL 104 12/23/2021   CO2 23 12/23/2021    Lab Results  Component Value Date   ALT 23 12/23/2021   AST 25 12/23/2021   ALKPHOS 39 07/07/2021   BILITOT 0.8 12/23/2021    Lab Results  Component Value Date   CHOL 209 (H) 12/23/2021   HDL 62 12/23/2021   LDLCALC 106 (H) 12/23/2021   LDLDIRECT 75.0 07/07/2021   TRIG 305 (H) 12/23/2021   CHOLHDL 3.4 12/23/2021   Lab Results  Component Value Date   LABRPR NON-REACTIVE 12/23/2021   HIV 1 RNA Quant (Copies/mL)  Date Value  12/23/2021 Not Detected  12/22/2020 Not Detected  10/16/2020 Not Detected   CD4 T Cell Abs (/uL)  Date Value  12/23/2021 533  12/22/2020 696  10/16/2020 549     Problem List Items Addressed This Visit       High   HIV disease ([place]) - Primary   Relevant Medications   elvitegravir-cobicistat-emtricitabine-tenofovir (GENVOYA) 150-150-200-10 MG TABS tablet     Unprioritized   Lumbar radiculopathy, right    He has acute on chronic right  lumbar radiculopathy.  It seems as though he has failed to have significant relief following physical therapy and a lumbar injections.  I gave him Dr. Hewitt Shorts contact information and suggested that he call and arrange a follow-up visit to discuss all treatment options including surgery.         Michel Bickers, MD [facility] [phone] pager   [phone] cell 06/02/2022, 9:51 AM

## 2022-06-02 NOTE — Assessment & Plan Note (Signed)
He has acute on chronic right lumbar radiculopathy.  It seems as though he has failed to have significant relief following physical therapy and a lumbar injections.  I gave him Dr. Hewitt Shorts contact information and suggested that he call and arrange a follow-up visit to discuss all treatment options including surgery.

## 2022-06-02 NOTE — Patient Instructions (Signed)
Call Dr. Dayton Bailiff tel:[phone]

## 2022-06-09 ENCOUNTER — Telehealth: Payer: Self-pay

## 2022-06-09 NOTE — Telephone Encounter (Signed)
Transition Care Management Unsuccessful Follow-up Telephone Call  Date of discharge and from where:  06/07/2022, [facility] ER  Attempts:  1st Attempt  Reason for unsuccessful TCM follow-up call:  No answer/busy

## 2022-06-10 NOTE — Telephone Encounter (Signed)
Transition Care Management Unsuccessful Follow-up Telephone Call  Date of discharge and from where:  06/07/2022, [facility] ER  Attempts:  2nd Attempt  Reason for unsuccessful TCM follow-up call:  No answer/busy

## 2022-06-11 ENCOUNTER — Other Ambulatory Visit: Payer: Self-pay | Admitting: Medical

## 2022-06-15 NOTE — Telephone Encounter (Signed)
Transition Care Management Unsuccessful Follow-up Telephone Call  Date of discharge and from where:  [facility], 06/07/2022  Attempts:  3rd Attempt  Reason for unsuccessful TCM follow-up call:  No answer/busy

## 2022-06-29 ENCOUNTER — Other Ambulatory Visit: Payer: Self-pay | Admitting: Neurosurgery

## 2022-06-29 DIAGNOSIS — M5127 Other intervertebral disc displacement, lumbosacral region: Secondary | ICD-10-CM

## 2022-07-12 ENCOUNTER — Ambulatory Visit
Admission: RE | Admit: 2022-07-12 | Discharge: 2022-07-12 | Disposition: A | Discharge status: Home / Self Care | Payer: 59 | Source: Ambulatory Visit | Attending: Neurosurgery | Admitting: Neurosurgery

## 2022-07-12 ENCOUNTER — Inpatient Hospital Stay: Admission: RE | Admit: 2022-07-12 | Payer: 59 | Source: Ambulatory Visit

## 2022-07-12 DIAGNOSIS — M5127 Other intervertebral disc displacement, lumbosacral region: Secondary | ICD-10-CM

## 2022-07-12 MED ORDER — GADOPICLENOL 0.5 MMOL/ML IV SOLN
7.0000 mL | Freq: Once | INTRAVENOUS | Status: AC | PRN
  Administered 2022-07-12: 7 mL via INTRAVENOUS

## 2022-07-25 ENCOUNTER — Other Ambulatory Visit: Payer: 59

## 2022-08-07 IMAGING — MR MR LUMBAR SPINE W/O CM
5 series · 31 of 48 positions shown · non-contrast
Comparison: None.

CLINICAL DATA: Lumbar radiculopathy, trauma focal lumbar
tenderness, groin numbness

EXAM:
MRI LUMBAR SPINE WITHOUT CONTRAST
TECHNIQUE: Multiplanar, multisequence MR imaging of the lumbar spine was
performed. No intravenous contrast was administered.

[Series 5: T1 · sagittal · 4.0mm · 0.81mm/px · 6 of 17 slices shown (1 of 2)]
[im 1/17]
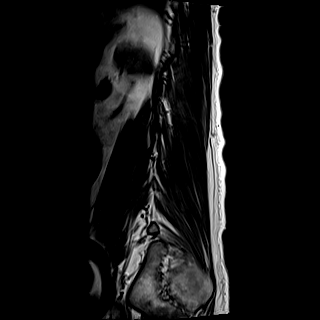
[im 4/17]
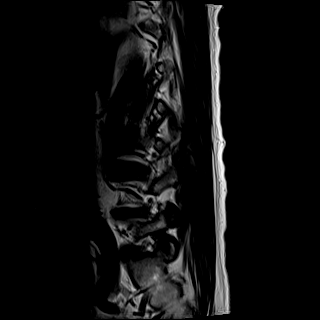
[im 7/17]
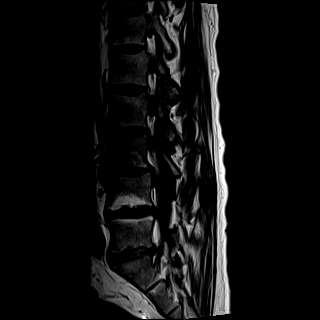
[im 10/17]
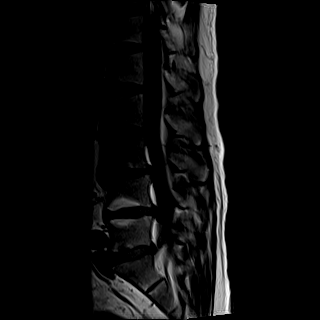
[im 13/17]
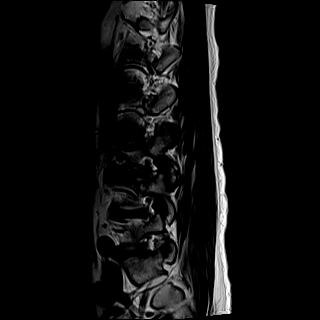
[im 17/17]
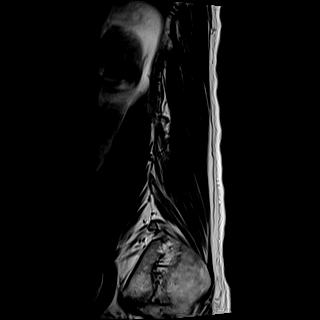

[Series 6: T2 · sagittal · 4.0mm · 0.81mm/px · 6 of 17 slices shown (1 of 2)]
[im 1/17]
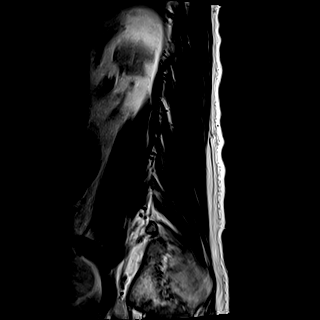
[im 4/17]
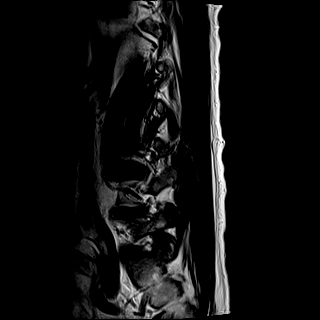
[im 7/17]
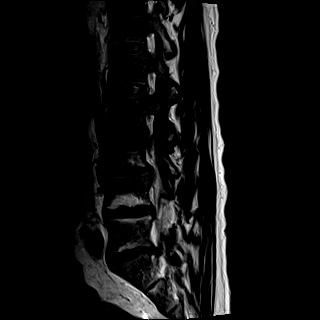
[im 10/17]
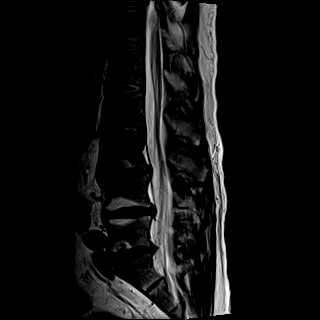
[im 13/17]
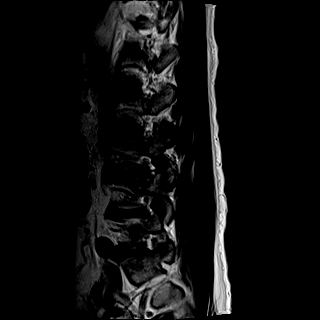
[im 17/17]
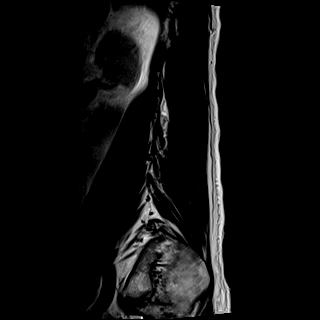

[Series 7: STIR · sagittal · 4.0mm · 0.51mm/px · 1 of 17 slices shown]
[im 1/17]
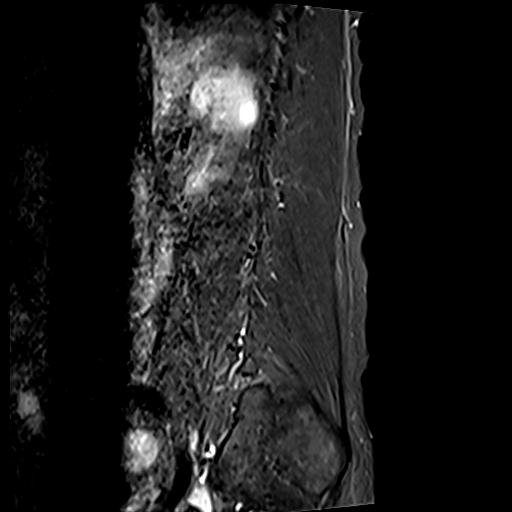

[Series 8: T2 · axial · 4.0mm · 0.78mm/px · z∈[-207,+11]mm · 9 of 41 slices shown (2 of 2)]
[im 1/41]
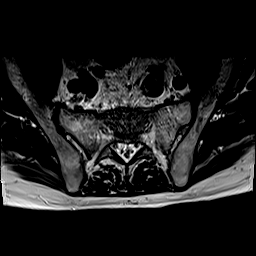
[im 6/41]
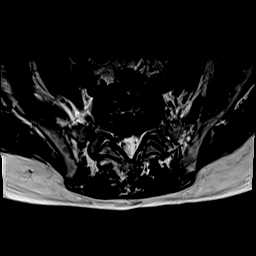
[im 12/41]
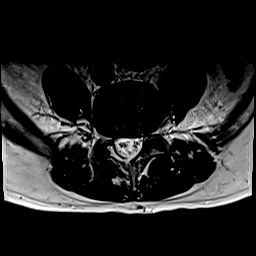
[im 18/41]
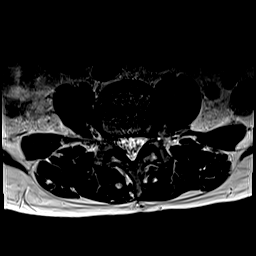
[im 21/41]
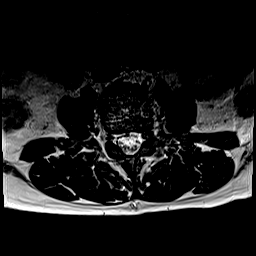
[im 23/41]
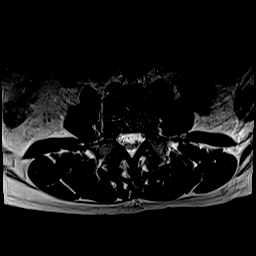
[im 29/41]
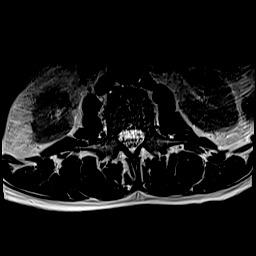
[im 35/41]
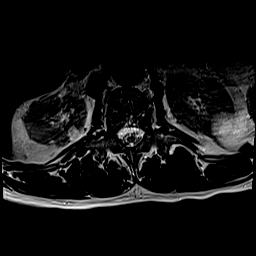
[im 41/41]
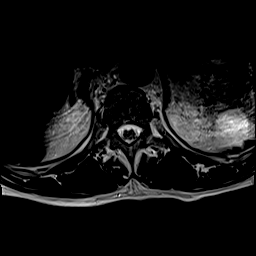

[Series 9: T1 · axial · 4.0mm · 0.39mm/px · z∈[-207,+11]mm · 9 of 41 slices shown (2 of 2)]
[im 1/41]
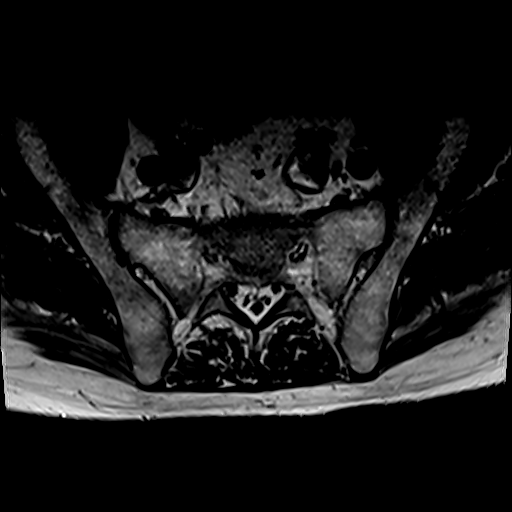
[im 6/41]
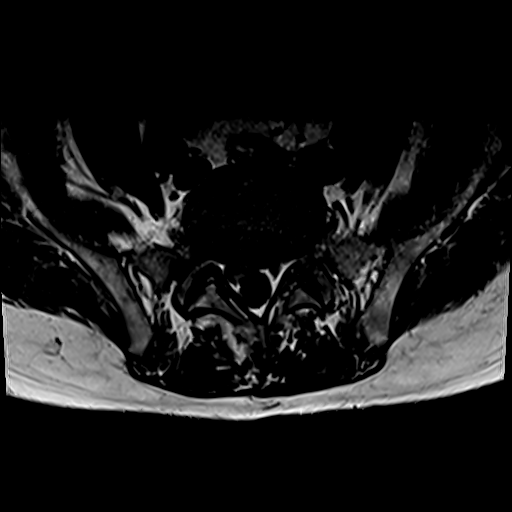
[im 12/41]
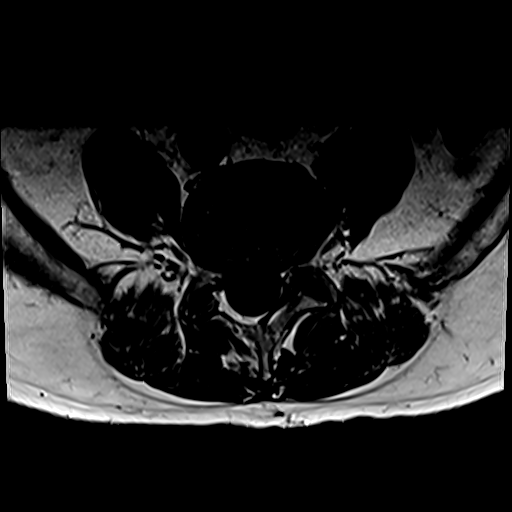
[im 18/41]
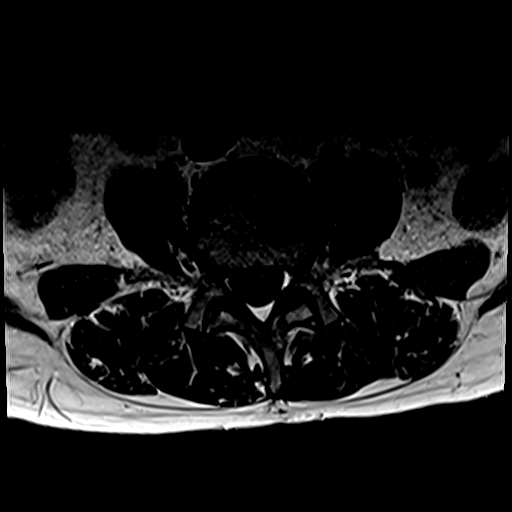
[im 21/41]
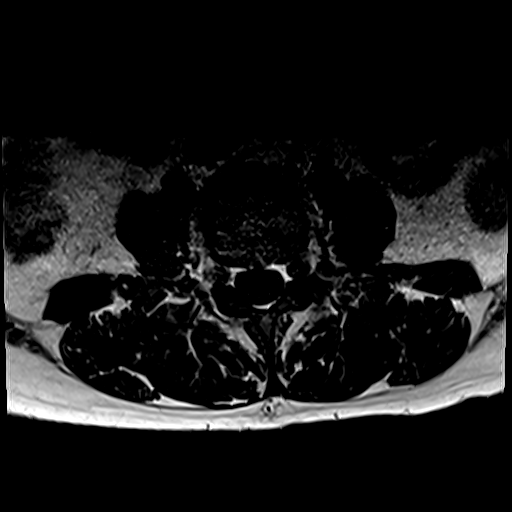
[im 23/41]
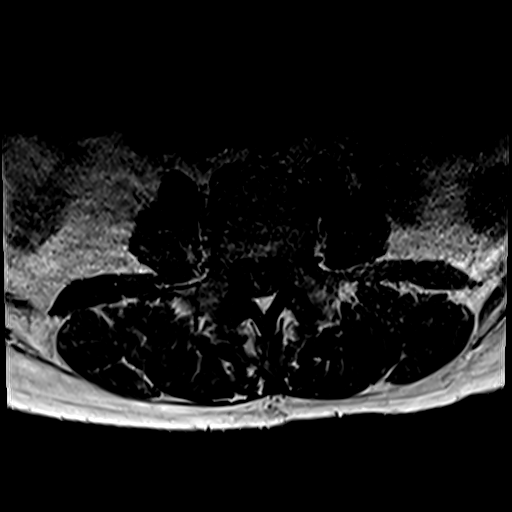
[im 29/41]
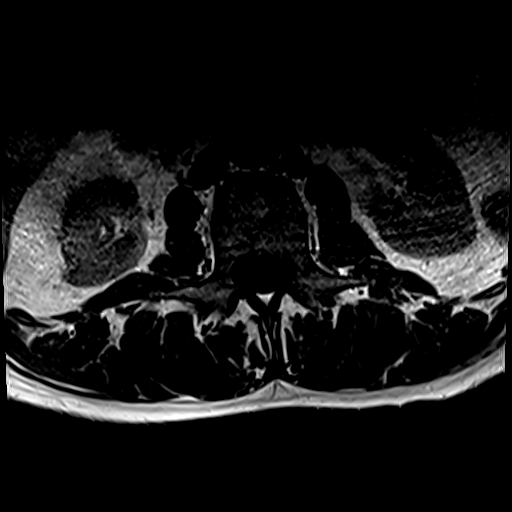
[im 35/41]
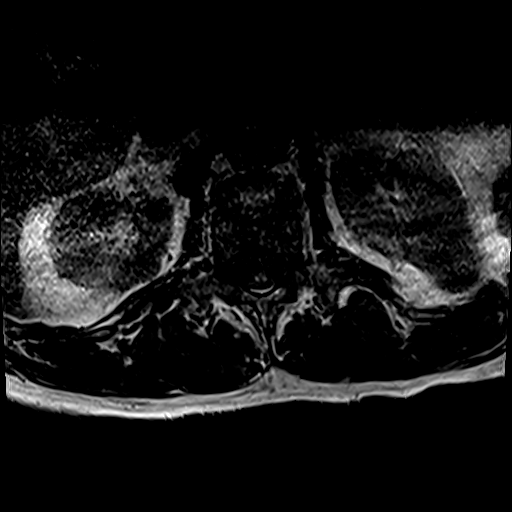
[im 41/41]
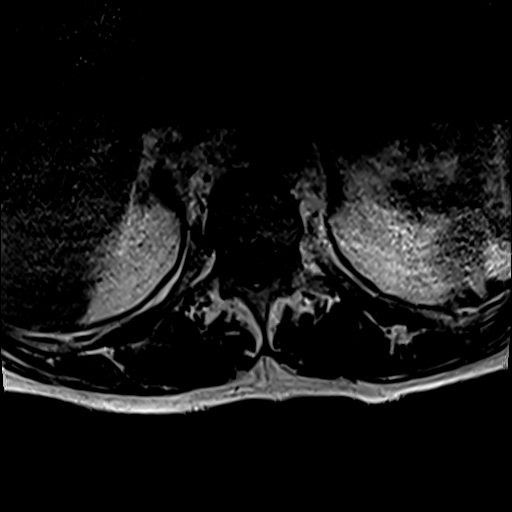

[31 of 48 positions shown; findings below may reference images not displayed]

FINDINGS: Segmentation:  Standard.

Alignment: 3 mm grade 1 anterolisthesis L3 on L4. Trace
retrolisthesis L5 on S1. Straightening of the lumbar lordosis.

Vertebrae: No fracture, evidence of discitis, or bone lesion. Mild
discogenic endplate marrow edema at the L3-4 and L5-S1 levels.
Chronic endplate marrow changes at L4-5.

Conus medullaris and cauda equina: Conus extends to the L1 level.
Conus and cauda equina appear normal.

Paraspinal and other soft tissues: Cortically based T2 hyperintense
lesionswithin the bilateral kidneys, incompletely characterized, but
most likely represent cysts.

Disc levels:

T12-L1: No significant disc protrusion, foraminal stenosis, or canal
stenosis.

L1-L2: No significant disc protrusion, foraminal stenosis, or canal
stenosis.

L2-L3: No significant disc protrusion, foraminal stenosis, or canal
stenosis.

L3-L4: Anterolisthesis with mild diffuse disc bulge. Minimal
bilateral facet hypertrophy. No canal stenosis. Mild bilateral
foraminal stenosis, left slightly worse than right.

L4-L5: Postsurgical changes to the right lamina. Mild diffuse disc
bulge, slightly eccentric to the left. Mild bilateral facet
arthropathy. Mild left foraminal stenosis. No canal stenosis.

L5-S1: Mild diffuse disc bulge with focal right subarticular disc
protrusion. There is resultant mass effect upon the descending right
S1 nerve root within the right subarticular recess. No significant
facet arthropathy. Mild bilateral foraminal stenosis. No canal
stenosis.
IMPRESSION: 1. Right subarticular disc protrusion at L5-S1 with resultant mass
effect upon the descending right S1 nerve root within the right
subarticular recess. Correlate for radicular symptoms in this
distribution.
2. Mild bilateral foraminal stenosis at L3-4 and L5-S1.
3. No canal stenosis at any level.

## 2022-09-08 ENCOUNTER — Other Ambulatory Visit: Payer: Self-pay | Admitting: Medical

## 2022-09-08 DIAGNOSIS — I10 Essential (primary) hypertension: Secondary | ICD-10-CM

## 2022-09-28 ENCOUNTER — Ambulatory Visit: Payer: 59

## 2022-09-28 ENCOUNTER — Other Ambulatory Visit: Payer: Self-pay

## 2022-11-22 ENCOUNTER — Other Ambulatory Visit (HOSPITAL_COMMUNITY): Payer: Self-pay

## 2022-12-23 ENCOUNTER — Other Ambulatory Visit: Payer: 59

## 2023-01-06 ENCOUNTER — Ambulatory Visit: Payer: 59 | Admitting: Internal Medicine

## 2023-01-19 ENCOUNTER — Ambulatory Visit (INDEPENDENT_AMBULATORY_CARE_PROVIDER_SITE_OTHER): Payer: 59 | Admitting: Internal Medicine

## 2023-01-19 ENCOUNTER — Other Ambulatory Visit: Payer: Self-pay

## 2023-01-19 ENCOUNTER — Encounter: Payer: Self-pay | Admitting: Internal Medicine

## 2023-01-19 VITALS — BP 109/79 | HR 104 | Temp 97.8°F | Ht 68.0 in | Wt 150.0 lb

## 2023-01-19 DIAGNOSIS — B20 Human immunodeficiency virus [HIV] disease: Secondary | ICD-10-CM | POA: Diagnosis not present

## 2023-01-19 DIAGNOSIS — N289 Disorder of kidney and ureter, unspecified: Secondary | ICD-10-CM | POA: Diagnosis not present

## 2023-01-19 DIAGNOSIS — E785 Hyperlipidemia, unspecified: Secondary | ICD-10-CM | POA: Diagnosis not present

## 2023-01-19 DIAGNOSIS — Z113 Encounter for screening for infections with a predominantly sexual mode of transmission: Secondary | ICD-10-CM | POA: Insufficient documentation

## 2023-01-19 MED ORDER — GENVOYA 150-150-200-10 MG PO TABS: 1.0000 | ORAL_TABLET | Freq: Every day | ORAL | 3 refills | Status: DC

## 2023-01-19 NOTE — Assessment & Plan Note (Signed)
Will screen 

## 2023-01-19 NOTE — Progress Notes (Signed)
   Subjective:    Patient ID: Lucas Watts, male    DOB: [birth year], 61 y.o.   MRN: [id number]  HPI Lucas Watts is here for follow up of HIV He continues on Genvoya and denies any missed doses.  No new issues.  Keeping active.  No issues with getting or taking his medication.     Review of Systems  Constitutional:  Negative for fatigue.  Gastrointestinal:  Negative for diarrhea.  Skin:  Negative for rash.       Objective:   Physical Exam Eyes:     General: No scleral icterus. Pulmonary:     Effort: Pulmonary effort is normal.  Neurological:     Mental Status: He is alert.   SH: + tobacco        Assessment & Plan:

## 2023-01-19 NOTE — Assessment & Plan Note (Signed)
Will check his lipid panel 

## 2023-01-19 NOTE — Assessment & Plan Note (Signed)
He is doing well.  Will check his labs today  Declined anal pap, wants to consider it next visit Refills provided Follow up in 1 year

## 2023-01-19 NOTE — Assessment & Plan Note (Signed)
Some elevation in creat and will check today.  No dose adjustment indicated.

## 2023-01-21 LAB — COMPLETE METABOLIC PANEL WITH GFR
AG Ratio: 1.3 (calc) (ref 1.0–2.5)
ALT: 44 U/L (ref 9–46)
AST: 55 U/L — ABNORMAL HIGH (ref 10–35)
Albumin: 4 g/dL (ref 3.6–5.1)
Alkaline phosphatase (APISO): 38 U/L (ref 35–144)
BUN: 10 mg/dL (ref 7–25)
CO2: 22 mmol/L (ref 20–32)
Calcium: 9.2 mg/dL (ref 8.6–10.3)
Chloride: 104 mmol/L (ref 98–110)
Creat: 1.31 mg/dL (ref 0.70–1.35)
Globulin: 3 g/dL (ref 1.9–3.7)
Glucose, Bld: 99 mg/dL (ref 65–99)
Potassium: 4.1 mmol/L (ref 3.5–5.3)
Sodium: 137 mmol/L (ref 135–146)
Total Bilirubin: 0.8 mg/dL (ref 0.2–1.2)
Total Protein: 7 g/dL (ref 6.1–8.1)
eGFR: 62 mL/min/{1.73_m2} (ref >=60)

## 2023-01-21 LAB — LIPID PANEL
Cholesterol: 228 mg/dL — ABNORMAL HIGH (ref <200)
HDL: 51 mg/dL (ref >=40)
Non-HDL Cholesterol (Calc): 177 mg/dL — ABNORMAL HIGH (ref <130)
Total CHOL/HDL Ratio: 4.5 (calc) (ref <5.0)
Triglycerides: 1039 mg/dL — ABNORMAL HIGH (ref <150)

## 2023-01-21 LAB — CBC WITH DIFFERENTIAL/PLATELET
Absolute Monocytes: 344 {cells}/uL (ref 200–950)
Basophils Absolute: 22 {cells}/uL (ref 0–200)
Basophils Relative: 0.5 %
Eosinophils Absolute: 22 {cells}/uL (ref 15–500)
Eosinophils Relative: 0.5 %
HCT: 37.6 % — ABNORMAL LOW (ref 38.5–50.0)
Hemoglobin: 12.7 g/dL — ABNORMAL LOW (ref 13.2–17.1)
Lymphs Abs: 1926 {cells}/uL (ref 850–3900)
MCH: 35.7 pg — ABNORMAL HIGH (ref 27.0–33.0)
MCHC: 33.8 g/dL (ref 32.0–36.0)
MCV: 105.6 fL — ABNORMAL HIGH (ref 80.0–100.0)
MPV: 8.9 fL (ref 7.5–12.5)
Monocytes Relative: 8 %
Neutro Abs: 1987 {cells}/uL (ref 1500–7800)
Neutrophils Relative %: 46.2 %
Platelets: 221 10*3/uL (ref 140–400)
RBC: 3.56 10*6/uL — ABNORMAL LOW (ref 4.20–5.80)
RDW: 12.7 % (ref 11.0–15.0)
Total Lymphocyte: 44.8 %
WBC: 4.3 10*3/uL (ref 3.8–10.8)

## 2023-01-21 LAB — RPR: RPR Ser Ql: NONREACTIVE

## 2023-01-21 LAB — T-HELPER CELLS (CD4) COUNT (NOT AT ARMC)
Absolute CD4: 828 {cells}/uL (ref 490–1740)
CD4 T Helper %: 46 % (ref 30–61)
Total lymphocyte count: 1812 {cells}/uL (ref 850–3900)

## 2023-01-21 LAB — C. TRACHOMATIS/N. GONORRHOEAE RNA
C. trachomatis RNA, TMA: NOT DETECTED
N. gonorrhoeae RNA, TMA: NOT DETECTED

## 2023-01-21 LAB — HIV-1 RNA QUANT-NO REFLEX-BLD
HIV 1 RNA Quant: NOT DETECTED {copies}/mL
HIV-1 RNA Quant, Log: NOT DETECTED {Log_copies}/mL

## 2023-02-27 ENCOUNTER — Other Ambulatory Visit: Payer: Self-pay | Admitting: Medical

## 2023-02-27 DIAGNOSIS — I10 Essential (primary) hypertension: Secondary | ICD-10-CM

## 2023-03-23 ENCOUNTER — Other Ambulatory Visit: Payer: Self-pay | Admitting: Medical

## 2023-03-23 DIAGNOSIS — I10 Essential (primary) hypertension: Secondary | ICD-10-CM

## 2023-09-22 ENCOUNTER — Other Ambulatory Visit: Payer: Self-pay | Admitting: Medical

## 2023-09-22 DIAGNOSIS — I10 Essential (primary) hypertension: Secondary | ICD-10-CM

## 2023-10-02 ENCOUNTER — Other Ambulatory Visit: Payer: Self-pay | Admitting: Medical

## 2023-10-04 NOTE — Progress Notes (Signed)
 The 10-year ASCVD risk score (Arnett DK, et al., 2019) is: 17.9%   Values used to calculate the score:     Age: 63 years     Sex: Male     Is Non-Hispanic African American: Yes     Diabetic: No     Tobacco smoker: Yes     Systolic Blood Pressure: 109 mmHg     Is BP treated: Yes     HDL Cholesterol: 51 mg/dL     Total Cholesterol: 228 mg/dL  Arlon Bergamo, BSN, RN

## 2024-02-10 ENCOUNTER — Other Ambulatory Visit: Payer: Self-pay | Admitting: Internal Medicine

## 2024-02-10 DIAGNOSIS — B20 Human immunodeficiency virus [HIV] disease: Secondary | ICD-10-CM

## 2024-02-23 ENCOUNTER — Other Ambulatory Visit (HOSPITAL_COMMUNITY): Payer: Self-pay

## 2024-02-23 ENCOUNTER — Ambulatory Visit (INDEPENDENT_AMBULATORY_CARE_PROVIDER_SITE_OTHER): Admitting: Internal Medicine

## 2024-02-23 ENCOUNTER — Other Ambulatory Visit: Payer: Self-pay

## 2024-02-23 ENCOUNTER — Encounter: Payer: Self-pay | Admitting: Internal Medicine

## 2024-02-23 VITALS — BP 126/88 | HR 67 | Temp 98.0°F | Ht 68.0 in | Wt 190.0 lb

## 2024-02-23 DIAGNOSIS — Z23 Encounter for immunization: Secondary | ICD-10-CM | POA: Diagnosis not present

## 2024-02-23 DIAGNOSIS — Z1159 Encounter for screening for other viral diseases: Secondary | ICD-10-CM

## 2024-02-23 DIAGNOSIS — B20 Human immunodeficiency virus [HIV] disease: Secondary | ICD-10-CM

## 2024-02-23 DIAGNOSIS — Z113 Encounter for screening for infections with a predominantly sexual mode of transmission: Secondary | ICD-10-CM

## 2024-02-23 MED ORDER — DOLUTEGRAVIR-LAMIVUDINE 50-300 MG PO TABS: 1.0000 | ORAL_TABLET | Freq: Every day | ORAL | 11 refills | Status: AC

## 2024-02-23 MED ORDER — ATORVASTATIN CALCIUM 40 MG PO TABS: 40.0000 mg | ORAL_TABLET | Freq: Every day | ORAL | 11 refills | Status: AC

## 2024-02-23 NOTE — Patient Instructions (Addendum)
 Lots of labs   For hiv meds Stop genvoya  Start dovato Talk to pharmacy today   Cholesterol/heart attack-stroke risk Start lipitor 40 mg a day Stop zetia    See me in 3 months and will do anal pap smear and recheck viral load   Smoking Cessation: QuitlineNC 1-800-QUIT-NOW 312-884-6401); Espaol: 1-855-Djelo-Ya (1-903-656-3938) http://carroll-castaneda.info/

## 2024-02-23 NOTE — Progress Notes (Signed)
   Subjective:    Patient ID: Lucas Watts, male    DOB: June 06, 1960, 63 y.o.   MRN: [id number]  HPI Lucas Watts is here for follow up of HIV He continues on Genvoya  and denies any missed doses.  No new issues.  Keeping active.  No issues with getting or taking his medication.   02/23/24 id clinic visit First visit with me today No missed dose genvoya  last 4 weeks  Well controlled  No complaint today   Review of Systems  Constitutional:  Negative for fatigue.  Gastrointestinal:  Negative for diarrhea.  Skin:  Negative for rash.       Objective:   Physical Exam Eyes:     General: No scleral icterus. Pulmonary:     Effort: Pulmonary effort is normal.  Neurological:     Mental Status: He is alert.     Labs; Lab Results  Component Value Date   WBC 4.3 01/19/2023   HGB 12.7 (L) 01/19/2023   HCT 37.6 (L) 01/19/2023   MCV 105.6 (H) 01/19/2023   PLT 221 01/19/2023   Last metabolic panel Lab Results  Component Value Date   GLUCOSE 99 01/19/2023   NA 137 01/19/2023   K 4.1 01/19/2023   CL 104 01/19/2023   CO2 22 01/19/2023   BUN 10 01/19/2023   CREATININE 1.31 01/19/2023   EGFR 62 01/19/2023   CALCIUM 9.2 01/19/2023   PROT 7.0 01/19/2023   ALBUMIN 4.1 07/07/2021   BILITOT 0.8 01/19/2023   ALKPHOS 39 07/07/2021   AST 55 (H) 01/19/2023   ALT 44 01/19/2023   ANIONGAP 12 08/01/2021   Hiv: Lab Results  Component Value Date   HIV1RNAQUANT Not Detected 01/19/2023   Lab Results  Component Value Date   CD4TCELL 46 01/19/2023   CD4TABS 533 12/23/2021           Assessment & Plan:   #hiv Dx'ed late 1994; sexual transmission (msm) Cd4 nadir unknown No hx OI Therapy: Started therapy right at diagnosis; has always been compliant 2017-c genvoya  Prior tx crixivan, epzicom/drv-r, truvada, azt  02/23/24 we dicussed tx options. He would like dovato    -discussed u=u -encourage compliance -switch to dovato HIV medication -labs today -f/u in 3 months  for dovato hiv viral load testing    #social -married (closed relationship; no sex); partner hiv positive and on meds -smokes -- 02/23/24 advise quitting -quit etoh 05/2023 -no drugs -retired; on ssi   #hcm -vaccination Flu shot today 02/23/24 -hepatitis Checking hep b today 10/25 -std testing Deferred, not sexually active; in relationship -tb testing 10/25 checking -metabolic/reprieve 02/2024 start lipitor; discussed reprieve -cancer screening Will do anal pap smear 3 months from now F/u pcp for other age appropriate cancer screening   #hlp Stop zetia  Start lipitor -- stop genvoya  and start dovato to reduce risk ddi

## 2024-02-24 LAB — T-HELPER CELLS (CD4) COUNT (NOT AT ARMC)
CD4 % Helper T Cell: 38 % (ref 33–65)
CD4 T Cell Abs: 774 /uL (ref 400–1790)

## 2024-02-28 LAB — CBC
HCT: 42.6 % (ref 38.5–50.0)
Hemoglobin: 13.9 g/dL (ref 13.2–17.1)
MCH: 33.5 pg — ABNORMAL HIGH (ref 27.0–33.0)
MCHC: 32.6 g/dL (ref 32.0–36.0)
MCV: 102.7 fL — ABNORMAL HIGH (ref 80.0–100.0)
MPV: 8.9 fL (ref 7.5–12.5)
Platelets: 275 Thousand/uL (ref 140–400)
RBC: 4.15 Million/uL — ABNORMAL LOW (ref 4.20–5.80)
RDW: 12.2 % (ref 11.0–15.0)
WBC: 4.5 Thousand/uL (ref 3.8–10.8)

## 2024-02-28 LAB — QUANTIFERON-TB GOLD PLUS
Mitogen-NIL: >10 [IU]/mL
NIL: 0.03 [IU]/mL
QuantiFERON-TB Gold Plus: NEGATIVE
TB1-NIL: 0 [IU]/mL
TB2-NIL: 0 [IU]/mL

## 2024-02-28 LAB — COMPLETE METABOLIC PANEL WITHOUT GFR
AG Ratio: 1.4 (calc) (ref 1.0–2.5)
ALT: 7 U/L — ABNORMAL LOW (ref 9–46)
AST: 13 U/L (ref 10–35)
Albumin: 4.2 g/dL (ref 3.6–5.1)
Alkaline phosphatase (APISO): 50 U/L (ref 35–144)
BUN/Creatinine Ratio: 11 (calc) (ref 6–22)
BUN: 15 mg/dL (ref 7–25)
CO2: 25 mmol/L (ref 20–32)
Calcium: 9.2 mg/dL (ref 8.6–10.3)
Chloride: 106 mmol/L (ref 98–110)
Creat: 1.38 mg/dL — ABNORMAL HIGH (ref 0.70–1.35)
Globulin: 3.1 g/dL (ref 1.9–3.7)
Glucose, Bld: 107 mg/dL — ABNORMAL HIGH (ref 65–99)
Potassium: 4.2 mmol/L (ref 3.5–5.3)
Sodium: 138 mmol/L (ref 135–146)
Total Bilirubin: 0.5 mg/dL (ref 0.2–1.2)
Total Protein: 7.3 g/dL (ref 6.1–8.1)

## 2024-02-28 LAB — HEPATITIS B SURFACE ANTIBODY, QUANTITATIVE: Hep B S AB Quant (Post): 508 m[IU]/mL (ref >=10)

## 2024-02-28 LAB — HEPATITIS B SURFACE ANTIGEN: Hepatitis B Surface Ag: NONREACTIVE

## 2024-02-28 LAB — HIV-1 RNA QUANT-NO REFLEX-BLD
HIV 1 RNA Quant: NOT DETECTED {copies}/mL
HIV-1 RNA Quant, Log: NOT DETECTED {Log_copies}/mL

## 2024-02-28 LAB — HEPATITIS B CORE ANTIBODY, TOTAL: Hep B Core Total Ab: REACTIVE — AB

## 2024-05-13 ENCOUNTER — Other Ambulatory Visit: Payer: Self-pay | Admitting: Medical

## 2024-05-13 DIAGNOSIS — I10 Essential (primary) hypertension: Secondary | ICD-10-CM

## 2024-06-06 ENCOUNTER — Other Ambulatory Visit: Payer: Self-pay | Admitting: Medical

## 2024-06-06 DIAGNOSIS — I10 Essential (primary) hypertension: Secondary | ICD-10-CM

## 2024-06-16 ENCOUNTER — Other Ambulatory Visit: Payer: Self-pay | Admitting: Medical

## 2024-06-16 DIAGNOSIS — I10 Essential (primary) hypertension: Secondary | ICD-10-CM

## 2024-06-19 ENCOUNTER — Other Ambulatory Visit: Payer: Self-pay | Admitting: Medical

## 2024-06-19 DIAGNOSIS — I10 Essential (primary) hypertension: Secondary | ICD-10-CM

## 2024-06-26 ENCOUNTER — Other Ambulatory Visit: Payer: Self-pay | Admitting: Medical

## 2024-06-26 DIAGNOSIS — I10 Essential (primary) hypertension: Secondary | ICD-10-CM

## 2024-06-29 ENCOUNTER — Other Ambulatory Visit: Payer: Self-pay | Admitting: Internal Medicine

## 2024-06-29 DIAGNOSIS — B20 Human immunodeficiency virus [HIV] disease: Secondary | ICD-10-CM
